# Patient Record
Sex: Female | Born: 1975
Health system: Southern US, Community
[De-identification: ages and names within clinical notes are randomized; demographics above are authoritative.]

## PROBLEM LIST (undated history)

## (undated) DIAGNOSIS — F32A Depression, unspecified: Secondary | ICD-10-CM

## (undated) DIAGNOSIS — B373 Candidiasis of vulva and vagina: Secondary | ICD-10-CM

## (undated) DIAGNOSIS — N201 Calculus of ureter: Secondary | ICD-10-CM

## (undated) DIAGNOSIS — B3731 Acute candidiasis of vulva and vagina: Secondary | ICD-10-CM

## (undated) DIAGNOSIS — F419 Anxiety disorder, unspecified: Secondary | ICD-10-CM

## (undated) DIAGNOSIS — K219 Gastro-esophageal reflux disease without esophagitis: Secondary | ICD-10-CM

## (undated) DIAGNOSIS — Z8759 Personal history of other complications of pregnancy, childbirth and the puerperium: Secondary | ICD-10-CM

## (undated) DIAGNOSIS — Z8742 Personal history of other diseases of the female genital tract: Secondary | ICD-10-CM

## (undated) DIAGNOSIS — R350 Frequency of micturition: Secondary | ICD-10-CM

## (undated) DIAGNOSIS — R3915 Urgency of urination: Secondary | ICD-10-CM

## (undated) DIAGNOSIS — Z973 Presence of spectacles and contact lenses: Secondary | ICD-10-CM

## (undated) HISTORY — DX: Anxiety disorder, unspecified: F41.9

## (undated) HISTORY — DX: Depression, unspecified: F32.A

## (undated) HISTORY — DX: Personal history of other complications of pregnancy, childbirth and the puerperium: Z87.59

---

## 1999-07-23 HISTORY — PX: WISDOM TOOTH EXTRACTION: SHX21

## 2009-11-01 ENCOUNTER — Encounter: Admission: RE | Admit: 2009-11-01 | Discharge: 2009-11-01 | Payer: Self-pay | Admitting: Family Medicine

## 2010-08-12 ENCOUNTER — Encounter: Payer: Self-pay | Admitting: Otolaryngology

## 2012-07-22 NOTE — L&D Delivery Note (Signed)
Delivery Note At 5:26 PM a viable and healthy female was delivered via VBAC, Spontaneous (Presentation: Right Occiput Anterior).  APGAR: 9, 9; weight -  pending Placenta status: Intact, Spontaneous.  Cord: 3 vessels with the following complications: None.  Cord pH: N/A  Anesthesia: Epidural  Episiotomy: None Lacerations: small left labial  Suture Repair: none Est. Blood Loss (mL): 250  Mom to postpartum.  Baby to nursery-stable.  Jamilia Jacques R 04/26/2013, 5:44 PM

## 2012-09-08 LAB — OB RESULTS CONSOLE HEPATITIS B SURFACE ANTIGEN: Hepatitis B Surface Ag: NEGATIVE

## 2012-09-08 LAB — OB RESULTS CONSOLE GC/CHLAMYDIA: Gonorrhea: NEGATIVE

## 2012-09-08 LAB — OB RESULTS CONSOLE RUBELLA ANTIBODY, IGM: Rubella: UNDETERMINED

## 2012-09-08 LAB — OB RESULTS CONSOLE RPR: RPR: NONREACTIVE

## 2012-09-08 LAB — OB RESULTS CONSOLE ANTIBODY SCREEN: Antibody Screen: NEGATIVE

## 2013-03-17 LAB — OB RESULTS CONSOLE GBS: GBS: NEGATIVE

## 2013-04-22 ENCOUNTER — Other Ambulatory Visit: Payer: Self-pay | Admitting: Obstetrics & Gynecology

## 2013-04-23 ENCOUNTER — Encounter (HOSPITAL_COMMUNITY): Payer: Self-pay | Admitting: *Deleted

## 2013-04-23 ENCOUNTER — Telehealth (HOSPITAL_COMMUNITY): Payer: Self-pay | Admitting: *Deleted

## 2013-04-23 NOTE — Telephone Encounter (Signed)
Preadmission screen  

## 2013-04-25 ENCOUNTER — Inpatient Hospital Stay (HOSPITAL_COMMUNITY)
Admission: AD | Admit: 2013-04-25 | Discharge: 2013-04-28 | DRG: 373 | Disposition: A | Discharge status: Home / Self Care | Payer: BC Managed Care – PPO | Source: Ambulatory Visit | Attending: Obstetrics and Gynecology | Admitting: Obstetrics and Gynecology

## 2013-04-25 ENCOUNTER — Encounter (HOSPITAL_COMMUNITY): Payer: Self-pay | Admitting: *Deleted

## 2013-04-25 DIAGNOSIS — Z98891 History of uterine scar from previous surgery: Secondary | ICD-10-CM

## 2013-04-25 DIAGNOSIS — O34219 Maternal care for unspecified type scar from previous cesarean delivery: Secondary | ICD-10-CM | POA: Diagnosis not present

## 2013-04-25 DIAGNOSIS — O480 Post-term pregnancy: Secondary | ICD-10-CM | POA: Diagnosis present

## 2013-04-25 DIAGNOSIS — O09529 Supervision of elderly multigravida, unspecified trimester: Secondary | ICD-10-CM | POA: Diagnosis present

## 2013-04-25 DIAGNOSIS — O9902 Anemia complicating childbirth: Secondary | ICD-10-CM | POA: Diagnosis present

## 2013-04-25 DIAGNOSIS — D509 Iron deficiency anemia, unspecified: Secondary | ICD-10-CM | POA: Diagnosis present

## 2013-04-25 LAB — CBC
HCT: 31.2 % — ABNORMAL LOW (ref 36.0–46.0)
Hemoglobin: 10.7 g/dL — ABNORMAL LOW (ref 12.0–15.0)
MCH: 28.8 pg (ref 26.0–34.0)
MCHC: 34.3 g/dL (ref 30.0–36.0)
MCV: 83.9 fL (ref 78.0–100.0)
Platelets: 145 10*3/uL — ABNORMAL LOW (ref 150–400)
RBC: 3.72 MIL/uL — ABNORMAL LOW (ref 3.87–5.11)
WBC: 8.3 10*3/uL (ref 4.0–10.5)

## 2013-04-25 LAB — AMNISURE RUPTURE OF MEMBRANE (ROM) NOT AT ARMC: Amnisure ROM: POSITIVE

## 2013-04-25 LAB — TYPE AND SCREEN: Antibody Screen: NEGATIVE

## 2013-04-25 LAB — ABO/RH: ABO/RH(D): O POS

## 2013-04-25 MED ORDER — TERBUTALINE SULFATE 1 MG/ML IJ SOLN: 0.2500 mg | Freq: Once | INTRAMUSCULAR | Status: AC | PRN

## 2013-04-25 MED ORDER — FLEET ENEMA 7-19 GM/118ML RE ENEM: 1.0000 | ENEMA | RECTAL | Status: DC | PRN

## 2013-04-25 MED ORDER — ACETAMINOPHEN 325 MG PO TABS: 650.0000 mg | ORAL_TABLET | ORAL | Status: DC | PRN

## 2013-04-25 MED ORDER — NALBUPHINE HCL 20 MG/ML IJ SOLN
10.0000 mg | INTRAMUSCULAR | Status: DC | PRN
  Filled 2013-04-25: qty 1

## 2013-04-25 MED ORDER — LACTATED RINGERS IV SOLN
INTRAVENOUS | Status: DC
  Administered 2013-04-25 – 2013-04-26 (×3): via INTRAVENOUS

## 2013-04-25 MED ORDER — IBUPROFEN 600 MG PO TABS: 600.0000 mg | ORAL_TABLET | Freq: Four times a day (QID) | ORAL | Status: DC | PRN

## 2013-04-25 MED ORDER — LIDOCAINE HCL (PF) 1 % IJ SOLN
30.0000 mL | INTRAMUSCULAR | Status: DC | PRN
  Filled 2013-04-25: qty 30

## 2013-04-25 MED ORDER — ONDANSETRON HCL 4 MG/2ML IJ SOLN
4.0000 mg | Freq: Four times a day (QID) | INTRAMUSCULAR | Status: DC | PRN
  Administered 2013-04-26: 4 mg via INTRAVENOUS
  Filled 2013-04-25: qty 2

## 2013-04-25 MED ORDER — OXYTOCIN 40 UNITS IN LACTATED RINGERS INFUSION - SIMPLE MED
62.5000 mL/h | INTRAVENOUS | Status: DC
  Administered 2013-04-26: 62.5 mL/h via INTRAVENOUS

## 2013-04-25 MED ORDER — OXYCODONE-ACETAMINOPHEN 5-325 MG PO TABS: 1.0000 | ORAL_TABLET | ORAL | Status: DC | PRN

## 2013-04-25 MED ORDER — OXYTOCIN BOLUS FROM INFUSION: 500.0000 mL | INTRAVENOUS | Status: DC

## 2013-04-25 MED ORDER — OXYTOCIN 40 UNITS IN LACTATED RINGERS INFUSION - SIMPLE MED
1.0000 m[IU]/min | INTRAVENOUS | Status: DC
  Administered 2013-04-25: 2 m[IU]/min via INTRAVENOUS
  Filled 2013-04-25: qty 1000

## 2013-04-25 MED ORDER — CITRIC ACID-SODIUM CITRATE 334-500 MG/5ML PO SOLN
30.0000 mL | ORAL | Status: DC | PRN
  Administered 2013-04-25: 30 mL via ORAL
  Filled 2013-04-25: qty 15

## 2013-04-25 MED ORDER — LACTATED RINGERS IV SOLN
500.0000 mL | INTRAVENOUS | Status: DC | PRN
  Administered 2013-04-26: 1000 mL via INTRAVENOUS

## 2013-04-25 NOTE — MAU Note (Signed)
Patient presents with complaint of leaking fluid since 1200 today.

## 2013-04-25 NOTE — MAU Note (Signed)
Fern negative.

## 2013-04-25 NOTE — H&P (Signed)
Melissa Beck is a 37 y.o. female presenting for admission due to leaking fluid since 72 30 pm/ Prev LTCS @ 32 weeks desires VBAC. SROM.  Clear fluid. sono 9/30 EFW 9 lb amniosure positive . History OB History   Grav Para Term Preterm Abortions TAB SAB Ect Mult Living   3 2 1 1      2      Past Medical History  Diagnosis Date  . Abnormal Pap smear   . History of placenta abruption   . AMA (advanced maternal age) multigravida 35+   . Tobacco use disorder    Past Surgical History  Procedure Laterality Date  . Gynecologic cryosurgery    . Cesarean section     Family History: family history includes COPD in her mother; Thrombocytopenia in her daughter. Social History:  reports that she has quit smoking. She does not have any smokeless tobacco history on file. Her alcohol and drug histories are not on file.   Prenatal Transfer Tool  Maternal Diabetes: No Genetic Screening: Normal Maternal Ultrasounds/Referrals: Normal Fetal Ultrasounds or other Referrals:  None Maternal Substance Abuse:  No Significant Maternal Medications:  None Significant Maternal Lab Results:  Lab values include: Group B Strep negative Other Comments:  None  ROS neg  Dilation: 1.5 Effacement (%): 80 Station: -3 Exam by:: L. Munford RN Blood pressure 133/74, pulse 80, temperature 98.2 F (36.8 C), temperature source Oral, resp. rate 18, height 5\' 7"  (1.702 m), weight 97.523 kg (215 lb), last menstrual period 07/15/2012. Maternal Exam:  Uterine Assessment: Contraction strength is mild.  Contraction frequency is irregular.   Abdomen: Patient reports no abdominal tenderness. Surgical scars: low transverse.   Estimated fetal weight is 9lb.   Fetal presentation: vertex  Introitus: Ferning test: negative.   Pelvis: adequate for delivery.   Cervix: Cervix evaluated by digital exam.     Fetal Exam Fetal Monitor Review: Baseline rate: 140.  Variability: moderate (6-25 bpm).   Pattern: accelerations present.     Fetal State Assessment: Category I - tracings are normal.     Physical Exam  Constitutional: She is oriented to person, place, and time. She appears well-developed and well-nourished.  HENT:  Head: Atraumatic.  Eyes: EOM are normal.  Neck: Neck supple.  Cardiovascular: Regular rhythm.   Respiratory: Breath sounds normal.  GI: Soft.  Musculoskeletal: She exhibits edema.  Neurological: She is alert and oriented to person, place, and time.  Skin: Skin is warm and dry.  Psychiatric: She has a normal mood and affect.    Prenatal labs: ABO, Rh: O/Positive/-- (02/18 0000) Antibody: Negative (02/18 0000) Rubella: Equivocal (02/18 0000) RPR: Nonreactive (02/18 0000)  HBsAg: Negative (02/18 0000)  HIV: Non-reactive (02/18 0000)  GBS: Negative (08/27 0000)   Assessment/Plan: Post term Previous C/S desires VBAC SROM P) admit pitocin augmentation routine labs. Epidural/analgesic  prn  Rubella vaccine pp  Intisar Claudio A 04/25/2013, 7:43 PM

## 2013-04-25 NOTE — MAU Note (Signed)
Pt presents with complaints of leakage of fluid since noon today. Denies any bleeding. States some mild contractions

## 2013-04-25 NOTE — MAU Note (Signed)
Specimen obtained for ferning.

## 2013-04-26 ENCOUNTER — Encounter (HOSPITAL_COMMUNITY): Payer: Self-pay | Admitting: Anesthesiology

## 2013-04-26 ENCOUNTER — Inpatient Hospital Stay (HOSPITAL_COMMUNITY): Payer: BC Managed Care – PPO | Admitting: Anesthesiology

## 2013-04-26 DIAGNOSIS — O34219 Maternal care for unspecified type scar from previous cesarean delivery: Secondary | ICD-10-CM | POA: Diagnosis not present

## 2013-04-26 LAB — CBC
HCT: 32.1 % — ABNORMAL LOW (ref 36.0–46.0)
Hemoglobin: 10.7 g/dL — ABNORMAL LOW (ref 12.0–15.0)
MCH: 28.2 pg (ref 26.0–34.0)
MCHC: 33.3 g/dL (ref 30.0–36.0)
MCV: 84.5 fL (ref 78.0–100.0)
RBC: 3.8 MIL/uL — ABNORMAL LOW (ref 3.87–5.11)

## 2013-04-26 LAB — RPR: RPR Ser Ql: NONREACTIVE

## 2013-04-26 MED ORDER — SIMETHICONE 80 MG PO CHEW: 80.0000 mg | CHEWABLE_TABLET | ORAL | Status: DC | PRN

## 2013-04-26 MED ORDER — PHENYLEPHRINE 40 MCG/ML (10ML) SYRINGE FOR IV PUSH (FOR BLOOD PRESSURE SUPPORT)
80.0000 ug | PREFILLED_SYRINGE | INTRAVENOUS | Status: DC | PRN
  Filled 2013-04-26: qty 5
  Filled 2013-04-26: qty 2

## 2013-04-26 MED ORDER — LIDOCAINE HCL (PF) 1 % IJ SOLN
INTRAMUSCULAR | Status: DC | PRN
  Administered 2013-04-26 (×2): 4 mL

## 2013-04-26 MED ORDER — ONDANSETRON HCL 4 MG PO TABS: 4.0000 mg | ORAL_TABLET | ORAL | Status: DC | PRN

## 2013-04-26 MED ORDER — NALBUPHINE SYRINGE 5 MG/0.5 ML
10.0000 mg | INJECTION | INTRAMUSCULAR | Status: DC | PRN
  Administered 2013-04-26: 10 mg via INTRAVENOUS
  Filled 2013-04-26 (×2): qty 1

## 2013-04-26 MED ORDER — LANOLIN HYDROUS EX OINT: TOPICAL_OINTMENT | CUTANEOUS | Status: DC | PRN

## 2013-04-26 MED ORDER — SENNOSIDES-DOCUSATE SODIUM 8.6-50 MG PO TABS
2.0000 | ORAL_TABLET | ORAL | Status: DC
  Administered 2013-04-27 (×2): 2 via ORAL

## 2013-04-26 MED ORDER — ONDANSETRON HCL 4 MG/2ML IJ SOLN: 4.0000 mg | INTRAMUSCULAR | Status: DC | PRN

## 2013-04-26 MED ORDER — DIPHENHYDRAMINE HCL 50 MG/ML IJ SOLN: 12.5000 mg | INTRAMUSCULAR | Status: DC | PRN

## 2013-04-26 MED ORDER — FENTANYL 2.5 MCG/ML BUPIVACAINE 1/10 % EPIDURAL INFUSION (WH - ANES)
14.0000 mL/h | INTRAMUSCULAR | Status: DC | PRN
  Filled 2013-04-26: qty 125

## 2013-04-26 MED ORDER — PHENYLEPHRINE 40 MCG/ML (10ML) SYRINGE FOR IV PUSH (FOR BLOOD PRESSURE SUPPORT)
80.0000 ug | PREFILLED_SYRINGE | INTRAVENOUS | Status: DC | PRN
  Filled 2013-04-26: qty 2

## 2013-04-26 MED ORDER — EPHEDRINE 5 MG/ML INJ
10.0000 mg | INTRAVENOUS | Status: DC | PRN
  Filled 2013-04-26: qty 2

## 2013-04-26 MED ORDER — FENTANYL 2.5 MCG/ML BUPIVACAINE 1/10 % EPIDURAL INFUSION (WH - ANES)
INTRAMUSCULAR | Status: DC | PRN
  Administered 2013-04-26: 14 mL/h via EPIDURAL

## 2013-04-26 MED ORDER — IBUPROFEN 600 MG PO TABS
600.0000 mg | ORAL_TABLET | Freq: Four times a day (QID) | ORAL | Status: DC
  Administered 2013-04-26 – 2013-04-28 (×6): 600 mg via ORAL
  Filled 2013-04-26 (×6): qty 1

## 2013-04-26 MED ORDER — EPHEDRINE 5 MG/ML INJ
10.0000 mg | INTRAVENOUS | Status: DC | PRN
  Filled 2013-04-26: qty 2
  Filled 2013-04-26: qty 4

## 2013-04-26 MED ORDER — DIBUCAINE 1 % RE OINT: 1.0000 "application " | TOPICAL_OINTMENT | RECTAL | Status: DC | PRN

## 2013-04-26 MED ORDER — OXYCODONE-ACETAMINOPHEN 5-325 MG PO TABS
1.0000 | ORAL_TABLET | ORAL | Status: DC | PRN
  Administered 2013-04-27: 1 via ORAL
  Filled 2013-04-26: qty 1

## 2013-04-26 MED ORDER — BENZOCAINE-MENTHOL 20-0.5 % EX AERO: 1.0000 "application " | INHALATION_SPRAY | CUTANEOUS | Status: DC | PRN

## 2013-04-26 MED ORDER — BUTORPHANOL TARTRATE 1 MG/ML IJ SOLN: 1.0000 mg | Freq: Once | INTRAMUSCULAR | Status: DC

## 2013-04-26 MED ORDER — TETANUS-DIPHTH-ACELL PERTUSSIS 5-2.5-18.5 LF-MCG/0.5 IM SUSP: 0.5000 mL | Freq: Once | INTRAMUSCULAR | Status: DC

## 2013-04-26 MED ORDER — LACTATED RINGERS IV SOLN: 500.0000 mL | Freq: Once | INTRAVENOUS | Status: DC

## 2013-04-26 MED ORDER — PRENATAL MULTIVITAMIN CH
1.0000 | ORAL_TABLET | Freq: Every day | ORAL | Status: DC
  Administered 2013-04-27: 1 via ORAL
  Filled 2013-04-26: qty 1

## 2013-04-26 MED ORDER — ZOLPIDEM TARTRATE 5 MG PO TABS: 5.0000 mg | ORAL_TABLET | Freq: Every evening | ORAL | Status: DC | PRN

## 2013-04-26 MED ORDER — DIPHENHYDRAMINE HCL 25 MG PO CAPS: 25.0000 mg | ORAL_CAPSULE | Freq: Four times a day (QID) | ORAL | Status: DC | PRN

## 2013-04-26 MED ORDER — WITCH HAZEL-GLYCERIN EX PADS: 1.0000 "application " | MEDICATED_PAD | CUTANEOUS | Status: DC | PRN

## 2013-04-26 NOTE — Progress Notes (Signed)
Melissa Beck is a 37 y.o. [id number] at [redacted]w[redacted]d by LMP admitted for rupture of membranes  Subjective: Chief Complaint  Patient presents with  . Rupture of Membranes   Not feeling much ctx Objective: BP 125/75  Pulse 75  Temp(Src) 97.9 F (36.6 C) (Oral)  Resp 20  Ht 5\' 7"  (1.702 m)  Wt 97.523 kg (215 lb)  BMI 33.67 kg/m2  SpO2 98%  LMP 07/15/2012    Pitocin 9 miu  FHT:  FHR: 130 bpm, variability: moderate,  accelerations:  Present,  decelerations:  Absent UC:   irregular, every ? 2-4 mins minutes SVE:   {2/60/-3 deviated to left Tracing: cat1  Labs: Lab Results  Component Value Date   WBC 8.3 04/25/2013   HGB 10.7* 04/25/2013   HCT 31.2* 04/25/2013   MCV 83.9 04/25/2013   PLT 145* 04/25/2013    Assessment / Plan: SROM Term gestation P) cont pitocin  Anticipated MOD:  NSVD  Melissa Beck A 04/26/2013, 9:47 AM

## 2013-04-26 NOTE — Progress Notes (Signed)
Melissa Beck is a 37 y.o. [id number] at [redacted]w[redacted]d by LMP admitted for rupture of membranes  Subjective: Chief Complaint  Patient presents with  . Rupture of Membranes  Pt was not started until 10 30 pm( per RN waited for husband to go home and get some things)  Objective:  Pitocin 4 MU BP 126/79  Pulse 76  Temp(Src) 98 F (36.7 C) (Oral)  Resp 18  Ht 5\' 7"  (1.702 m)  Wt 97.523 kg (215 lb)  BMI 33.67 kg/m2  LMP 07/15/2012  VE deferred    FHT:  FHR: 130 bpm, variability: moderate,  accelerations:  Present,  decelerations:  Absent UC:   irreg SVE:   deferred Tracing: cat 1  Labs: Lab Results  Component Value Date   WBC 8.3 04/25/2013   HGB 10.7* 04/25/2013   HCT 31.2* 04/25/2013   MCV 83.9 04/25/2013   PLT 145* 04/25/2013    Assessment / Plan: SROM Postterm Previous C/S P) advised RN to increase pitocin  Anticipated MOD:  NSVD  Ashliegh Parekh A 04/26/2013, 12:36 AM

## 2013-04-26 NOTE — Progress Notes (Signed)
Melissa Beck is a 37 y.o. G3P1102 at [redacted]w[redacted]d, admitted yesterday after SROM. Pitocin overnight and went into active labor this afternoon. S/p epidural and comfortable. Was 9 cm at 2 pm and completely dilated at 4.15 pm, Will increase pitocin and reassess labor IOL   Objective: BP 121/81  Pulse 83  Temp(Src) 98.4 F (36.9 C) (Oral)  Resp 20  Ht 5\' 7"  (1.702 m)  Wt 215 lb (97.523 kg)  BMI 33.67 kg/m2  SpO2 100%  LMP 07/15/2012   Total I/O In: -  Out: 250 [Urine:250]  FHT:  FHR: 130 bpm, variability: moderate,  accelerations:  Present,  decelerations:  Present occ variable decels. recurrent decels resolved with pitocin was cut back from 18 to 5 mu rate, UC:   regular, every 3-4 minutes SVE:   Dilation: 10 Effacement (%): 100 Station: 0 Exam by:: Shivon Hackel  Assessment / Plan: Spontaneous labor, progressing normally  Labor: Progressing normally Fetal Wellbeing:  Category I Pain Control:  Epidural Anticipated MOD:  NSVD, 9 lbs, prepare for shoulder dystocia  Kaito Schulenburg R 04/26/2013, 4:52 PM

## 2013-04-26 NOTE — Anesthesia Preprocedure Evaluation (Signed)
Anesthesia Evaluation  Patient identified by MRN, date of birth, ID band Patient awake    Reviewed: Allergy & Precautions, H&P , Patient's Chart, lab work & pertinent test results  Airway Mallampati: II TM Distance: >3 FB Neck ROM: Full    Dental no notable dental hx. (+) Teeth Intact   Pulmonary neg pulmonary ROS,  breath sounds clear to auscultation- rhonchi  Pulmonary exam normal       Cardiovascular negative cardio ROS  Rhythm:Regular Rate:Normal     Neuro/Psych PSYCHIATRIC DISORDERS negative neurological ROS     GI/Hepatic Neg liver ROS, GERD-  Medicated and Controlled,  Endo/Other  negative endocrine ROS  Renal/GU negative Renal ROS  negative genitourinary   Musculoskeletal negative musculoskeletal ROS (+)   Abdominal   Peds  Hematology Thrombocytopenia-mild   Anesthesia Other Findings   Reproductive/Obstetrics Previous C/Section for Placental abruption                           Anesthesia Physical Anesthesia Plan  ASA: II  Anesthesia Plan: Epidural   Post-op Pain Management:    Induction:   Airway Management Planned: Natural Airway  Additional Equipment:   Intra-op Plan:   Post-operative Plan:   Informed Consent: I have reviewed the patients History and Physical, chart, labs and discussed the procedure including the risks, benefits and alternatives for the proposed anesthesia with the patient or authorized representative who has indicated his/her understanding and acceptance.   Dental advisory given  Plan Discussed with: Anesthesiologist  Anesthesia Plan Comments:         Anesthesia Quick Evaluation

## 2013-04-26 NOTE — Anesthesia Procedure Notes (Signed)
Epidural Patient location during procedure: OB Start time: 04/26/2013 1:19 PM  Staffing Anesthesiologist: Anda Sobotta A. Performed by: anesthesiologist   Preanesthetic Checklist Completed: patient identified, site marked, surgical consent, pre-op evaluation, timeout performed, IV checked, risks and benefits discussed and monitors and equipment checked  Epidural Patient position: sitting Prep: site prepped and draped and DuraPrep Patient monitoring: continuous pulse ox and blood pressure Approach: midline Injection technique: LOR air  Needle:  Needle type: Tuohy  Needle gauge: 17 G Needle length: 9 cm and 9 Needle insertion depth: 6 cm Catheter type: closed end flexible Catheter size: 19 Gauge Catheter at skin depth: 10 cm Test dose: negative and Other  Assessment Events: blood not aspirated, injection not painful, no injection resistance, negative IV test and no paresthesia  Additional Notes Patient identified. Risks and benefits discussed including failed block, incomplete  Pain control, post dural puncture headache, nerve damage, paralysis, blood pressure Changes, nausea, vomiting, reactions to medications-both toxic and allergic and post Partum back pain. All questions were answered. Patient expressed understanding and wished to proceed. Sterile technique was used throughout procedure. Epidural site was Dressed with sterile barrier dressing. No paresthesias, signs of intravascular injection Or signs of intrathecal spread were encountered.  Patient was more comfortable after the epidural was dosed. Please see RN's note for documentation of vital signs and FHR which are stable.

## 2013-04-26 NOTE — Progress Notes (Signed)
Ephedrine unused after epidural wasted. Phenylephrine wasted not used. Dherr rn Mickie Bail, RNC

## 2013-04-27 ENCOUNTER — Inpatient Hospital Stay (HOSPITAL_COMMUNITY): Admission: RE | Admit: 2013-04-27 | Payer: BC Managed Care – PPO | Source: Ambulatory Visit

## 2013-04-27 ENCOUNTER — Encounter (HOSPITAL_COMMUNITY): Payer: Self-pay | Admitting: Obstetrics and Gynecology

## 2013-04-27 LAB — CBC
HCT: 27.7 % — ABNORMAL LOW (ref 36.0–46.0)
Hemoglobin: 9.2 g/dL — ABNORMAL LOW (ref 12.0–15.0)
MCH: 28 pg (ref 26.0–34.0)
MCHC: 33.2 g/dL (ref 30.0–36.0)
MCV: 84.5 fL (ref 78.0–100.0)
RBC: 3.28 MIL/uL — ABNORMAL LOW (ref 3.87–5.11)

## 2013-04-27 LAB — SURGICAL PCR SCREEN: Staphylococcus aureus: POSITIVE — AB

## 2013-04-27 MED ORDER — FAMOTIDINE 20 MG PO TABS
40.0000 mg | ORAL_TABLET | Freq: Once | ORAL | Status: AC
  Administered 2013-04-27: 40 mg via ORAL
  Filled 2013-04-27: qty 1

## 2013-04-27 MED ORDER — CHLORHEXIDINE GLUCONATE CLOTH 2 % EX PADS: 6.0000 | MEDICATED_PAD | Freq: Every day | CUTANEOUS | Status: DC

## 2013-04-27 MED ORDER — LACTATED RINGERS IV SOLN: INTRAVENOUS | Status: DC

## 2013-04-27 MED ORDER — POLYSACCHARIDE IRON COMPLEX 150 MG PO CAPS
150.0000 mg | ORAL_CAPSULE | Freq: Every day | ORAL | Status: DC
  Administered 2013-04-27 – 2013-04-28 (×2): 150 mg via ORAL
  Filled 2013-04-27 (×2): qty 1

## 2013-04-27 MED ORDER — METOCLOPRAMIDE HCL 10 MG PO TABS
10.0000 mg | ORAL_TABLET | Freq: Once | ORAL | Status: AC
  Administered 2013-04-27: 10 mg via ORAL
  Filled 2013-04-27: qty 1

## 2013-04-27 MED ORDER — MUPIROCIN 2 % EX OINT
1.0000 "application " | TOPICAL_OINTMENT | Freq: Two times a day (BID) | CUTANEOUS | Status: DC
  Administered 2013-04-27 (×3): 1 via NASAL
  Filled 2013-04-27: qty 22

## 2013-04-27 NOTE — Progress Notes (Signed)
PPD 1 SVD  S:  Reports feeling well -desires postpartum tubal sterilization today              Desires discharge this afternoon             Tolerating po/ No nausea or vomiting             Bleeding is light             Pain controlled with motrin             Up ad lib / ambulatory / voiding QS  Newborn breast feeding  / Circumcision planned today  O:               VS: BP 98/64  Pulse 79  Temp(Src) 98.4 F (36.9 C) (Oral)  Resp 18  Ht 5\' 7"  (1.702 m)  Wt 97.523 kg (215 lb)  BMI 33.67 kg/m2  SpO2 100%  LMP 07/15/2012   LABS:              Recent Labs  04/26/13 1237 04/27/13 0610  WBC 8.2 9.8  HGB 10.7* 9.2*  PLT 141* 130*               Blood type: --/--/O POS, O POS (10/05 1940)  Rubella: Equivocal (02/18 0000)                     I&O: Intake/Output     10/06 0701 - 10/07 0700 10/07 0701 - 10/08 0700   Urine (mL/kg/hr) 850 (0.4)    Blood 250 (0.1)    Total Output 1100     Net -1100                        Physical Exam:             Alert and oriented X3  Lungs: Clear and unlabored  Heart: regular rate and rhythm / no mumurs  Abdomen: soft, non-tender, non-distended              Fundus: firm, non-tender, U-1  Perineum: no edema  Lochia: light  Extremities: no edema, no calf pain or tenderness    A: PPD # 1              Undesired fertility - request tubal sterilization during current hospitalization             IDA of pregnancy - delivered  Doing well - stable status  P: Routine post partum orders  NPO for BTL this afternoon with Dr Seymour Bars             Iron supplements x 6 weeks             Anticipate DC home later this pm if stable s/p BTL   Melissa Beck CNM, MSN, [facility] 04/27/2013, 9:33 AM

## 2013-04-27 NOTE — Anesthesia Postprocedure Evaluation (Signed)
  Anesthesia Post-op Note  Anesthesia Post Note  Patient: Melissa Beck  Procedure(s) Performed: * No procedures listed *  Anesthesia type: Epidural  Patient location: Mother/Baby  Post pain: Pain level controlled  Post assessment: Post-op Vital signs reviewed  Last Vitals:  Filed Vitals:   04/27/13 0555  BP: 105/69  Pulse: 72  Temp: 36.3 C  Resp: 16    Post vital signs: Reviewed  Level of consciousness:alert  Complications: No apparent anesthesia complications

## 2013-04-28 MED ORDER — MEASLES, MUMPS & RUBELLA VAC ~~LOC~~ INJ
0.5000 mL | INJECTION | Freq: Once | SUBCUTANEOUS | Status: AC
  Administered 2013-04-28: 0.5 mL via SUBCUTANEOUS
  Filled 2013-04-28 (×2): qty 0.5

## 2013-04-28 MED ORDER — IBUPROFEN 600 MG PO TABS: 600.0000 mg | ORAL_TABLET | Freq: Four times a day (QID) | ORAL | Status: DC

## 2013-04-28 NOTE — Progress Notes (Signed)
Patient ID: Alzina Golda, female   DOB: Jul 26, 1975, 37 y.o.   MRN: [id number] Post Partum Day #2            Information for the patient's newborn:  Mylah, Baynes [[id number]]  female   / circumcision in progress Feeding: breast  Subjective: No HA, SOB, CP, F/C, breast symptoms. Pain well-controlled with ibuprofen. Normal vaginal bleeding, no clots.      Objective:  Temp:  [98 F (36.7 C)-99 F (37.2 C)] 98 F (36.7 C) (10/08 0628) Pulse Rate:  [65-80] 65 (10/08 0628) Resp:  [16-18] 18 (10/08 0628) BP: (98-126)/(64-82) 120/82 mmHg (10/08 0628)   Recent Labs  04/26/13 1237 04/27/13 0610  WBC 8.2 9.8  HGB 10.7* 9.2*  HCT 32.1* 27.7*  PLT 141* 130*    Blood type: O POS, O POS (10/05 1940) Rubella: Equivocal (02/18 0000)    Physical Exam:  General: alert, cooperative and no distress Uterine Fundus: firm, midline, U-2 Lochia: appropriate, minimal Perineum: No edema  DVT Evaluation: No evidence of DVT seen on physical exam. Negative Homan's sign. No cords or calf tenderness. Calf/Ankle 1+ edema is present.    Assessment/Plan: PPD # 2 / 37 y.o., [id number] S/P: vaginal delivery   Principal Problem:   Postpartum care following vaginal delivery (10/6) Active Problems:   Previous cesarean section - tolac   VBAC, delivered (10/6) IDA anemia, delivered   Normal postpartum exam  Continue current postpartum care  D/C home   LOS: 3 days   Raelyn Mora, M, MSN, CNM 04/28/2013, 8:22 AM

## 2013-04-28 NOTE — Discharge Summary (Signed)
Obstetric Discharge Summary Reason for Admission: rupture of membranes Prenatal Procedures: ultrasound Intrapartum Procedures: spontaneous vaginal delivery Postpartum Procedures: none Complications-Operative and Postpartum: IDA Anemia Hemoglobin  Date Value Range Status  04/27/2013 9.2* 12.0 - 15.0 g/dL Final     HCT  Date Value Range Status  04/27/2013 27.7* 36.0 - 46.0 % Final    Physical Exam:  General: alert, cooperative and no distress Lochia: appropriate Uterine Fundus: firm DVT Evaluation: No evidence of DVT seen on physical exam. Negative Homan's sign. Calf/Ankle 1+ edema is present.  Discharge Diagnoses: Term Pregnancy-delivered        VBAC Discharge Information: Date: 04/28/2013 Activity: pelvic rest Diet: routine Medications: PNV, Ibuprofen and Iron Condition: stable Instructions: refer to practice specific booklet Discharge to: home Follow-up Information   Follow up with LAVOIE,MARIE-LYNE, MD. Schedule an appointment as soon as possible for a visit in 6 weeks. (Call to schedule outpatient tubal ligation)    Specialty:  Obstetrics and Gynecology   Contact information:   [address] [place] [address] [phone]       Newborn Data: Live born female on 04/26/2013 Birth Weight: 8 lb 2.7 oz (3705 g) APGAR: 9, 9  Home with mother.  Kenard Gower, MSN, CNM 04/28/2013, 8:29 AM

## 2013-05-14 ENCOUNTER — Encounter (HOSPITAL_COMMUNITY): Payer: Self-pay | Admitting: Pharmacist

## 2013-05-17 ENCOUNTER — Encounter (HOSPITAL_COMMUNITY): Payer: Self-pay

## 2013-05-24 ENCOUNTER — Other Ambulatory Visit: Payer: Self-pay | Admitting: Obstetrics & Gynecology

## 2013-05-28 ENCOUNTER — Ambulatory Visit (HOSPITAL_COMMUNITY)
Admission: RE | Admit: 2013-05-28 | Discharge: 2013-05-28 | Disposition: A | Discharge status: Home / Self Care | Payer: BC Managed Care – PPO | Source: Ambulatory Visit | Attending: Obstetrics & Gynecology | Admitting: Obstetrics & Gynecology

## 2013-05-28 ENCOUNTER — Ambulatory Visit (HOSPITAL_COMMUNITY): Payer: BC Managed Care – PPO | Admitting: Anesthesiology

## 2013-05-28 ENCOUNTER — Encounter (HOSPITAL_COMMUNITY)
Admission: RE | Disposition: A | Discharge status: Home / Self Care | Payer: Self-pay | Source: Ambulatory Visit | Attending: Obstetrics & Gynecology

## 2013-05-28 ENCOUNTER — Encounter (HOSPITAL_COMMUNITY): Payer: Self-pay | Admitting: Anesthesiology

## 2013-05-28 ENCOUNTER — Encounter (HOSPITAL_COMMUNITY): Payer: BC Managed Care – PPO | Admitting: Anesthesiology

## 2013-05-28 DIAGNOSIS — Z302 Encounter for sterilization: Secondary | ICD-10-CM | POA: Insufficient documentation

## 2013-05-28 HISTORY — PX: LAPAROSCOPIC TUBAL LIGATION: SHX1937

## 2013-05-28 LAB — CBC
HCT: 36.9 % (ref 36.0–46.0)
Hemoglobin: 12.3 g/dL (ref 12.0–15.0)
MCH: 27.4 pg (ref 26.0–34.0)
MCHC: 33.3 g/dL (ref 30.0–36.0)
MCV: 82.2 fL (ref 78.0–100.0)
RDW: 13.8 % (ref 11.5–15.5)

## 2013-05-28 SURGERY — LIGATION, FALLOPIAN TUBE, LAPAROSCOPIC
Anesthesia: General | Site: Abdomen | Laterality: Bilateral | Wound class: Clean Contaminated

## 2013-05-28 MED ORDER — LACTATED RINGERS IV SOLN
INTRAVENOUS | Status: DC
  Administered 2013-05-28 (×2): via INTRAVENOUS

## 2013-05-28 MED ORDER — FENTANYL CITRATE 0.05 MG/ML IJ SOLN: 25.0000 ug | INTRAMUSCULAR | Status: DC | PRN

## 2013-05-28 MED ORDER — KETOROLAC TROMETHAMINE 30 MG/ML IJ SOLN
INTRAMUSCULAR | Status: DC | PRN
  Administered 2013-05-28: 30 mg via INTRAVENOUS

## 2013-05-28 MED ORDER — NEOSTIGMINE METHYLSULFATE 1 MG/ML IJ SOLN
INTRAMUSCULAR | Status: AC
  Filled 2013-05-28: qty 1

## 2013-05-28 MED ORDER — ROCURONIUM BROMIDE 100 MG/10ML IV SOLN
INTRAVENOUS | Status: DC | PRN
  Administered 2013-05-28: 25 mg via INTRAVENOUS

## 2013-05-28 MED ORDER — MEPERIDINE HCL 25 MG/ML IJ SOLN: 6.2500 mg | INTRAMUSCULAR | Status: DC | PRN

## 2013-05-28 MED ORDER — MIDAZOLAM HCL 2 MG/2ML IJ SOLN
INTRAMUSCULAR | Status: AC
  Filled 2013-05-28: qty 2

## 2013-05-28 MED ORDER — CEFAZOLIN SODIUM-DEXTROSE 2-3 GM-% IV SOLR
INTRAVENOUS | Status: AC
  Filled 2013-05-28: qty 50

## 2013-05-28 MED ORDER — LIDOCAINE HCL (CARDIAC) 20 MG/ML IV SOLN
INTRAVENOUS | Status: DC | PRN
  Administered 2013-05-28: 70 mg via INTRAVENOUS
  Administered 2013-05-28: 30 mg via INTRAVENOUS

## 2013-05-28 MED ORDER — OXYCODONE-ACETAMINOPHEN 7.5-325 MG PO TABS: 1.0000 | ORAL_TABLET | ORAL | Status: DC | PRN

## 2013-05-28 MED ORDER — KETOROLAC TROMETHAMINE 30 MG/ML IJ SOLN: 15.0000 mg | Freq: Once | INTRAMUSCULAR | Status: DC | PRN

## 2013-05-28 MED ORDER — KETOROLAC TROMETHAMINE 30 MG/ML IJ SOLN
INTRAMUSCULAR | Status: AC
  Filled 2013-05-28: qty 1

## 2013-05-28 MED ORDER — MIDAZOLAM HCL 2 MG/2ML IJ SOLN
INTRAMUSCULAR | Status: DC | PRN
  Administered 2013-05-28: 2 mg via INTRAVENOUS

## 2013-05-28 MED ORDER — LIDOCAINE HCL (CARDIAC) 20 MG/ML IV SOLN
INTRAVENOUS | Status: AC
  Filled 2013-05-28: qty 5

## 2013-05-28 MED ORDER — DEXAMETHASONE SODIUM PHOSPHATE 10 MG/ML IJ SOLN
INTRAMUSCULAR | Status: DC | PRN
  Administered 2013-05-28: 10 mg via INTRAVENOUS

## 2013-05-28 MED ORDER — GLYCOPYRROLATE 0.2 MG/ML IJ SOLN
INTRAMUSCULAR | Status: AC
  Filled 2013-05-28: qty 3

## 2013-05-28 MED ORDER — SCOPOLAMINE 1 MG/3DAYS TD PT72
MEDICATED_PATCH | TRANSDERMAL | Status: AC
  Filled 2013-05-28: qty 1

## 2013-05-28 MED ORDER — ONDANSETRON HCL 4 MG/2ML IJ SOLN
INTRAMUSCULAR | Status: AC
  Filled 2013-05-28: qty 2

## 2013-05-28 MED ORDER — SCOPOLAMINE 1 MG/3DAYS TD PT72
1.0000 | MEDICATED_PATCH | TRANSDERMAL | Status: DC
  Administered 2013-05-28: 1.5 mg via TRANSDERMAL

## 2013-05-28 MED ORDER — ONDANSETRON HCL 4 MG/2ML IJ SOLN: 4.0000 mg | Freq: Once | INTRAMUSCULAR | Status: DC | PRN

## 2013-05-28 MED ORDER — PROPOFOL 10 MG/ML IV BOLUS
INTRAVENOUS | Status: DC | PRN
  Administered 2013-05-28: 160 mg via INTRAVENOUS

## 2013-05-28 MED ORDER — BUPIVACAINE HCL (PF) 0.25 % IJ SOLN
INTRAMUSCULAR | Status: AC
  Filled 2013-05-28: qty 30

## 2013-05-28 MED ORDER — CEFAZOLIN SODIUM-DEXTROSE 2-3 GM-% IV SOLR
2.0000 g | INTRAVENOUS | Status: AC
  Administered 2013-05-28: 2 g via INTRAVENOUS

## 2013-05-28 MED ORDER — FLUMAZENIL 0.5 MG/5ML IV SOLN
INTRAVENOUS | Status: DC | PRN
  Administered 2013-05-28: .15 mg via INTRAVENOUS

## 2013-05-28 MED ORDER — NEOSTIGMINE METHYLSULFATE 1 MG/ML IJ SOLN
INTRAMUSCULAR | Status: DC | PRN
  Administered 2013-05-28: 3 mg via INTRAVENOUS

## 2013-05-28 MED ORDER — PROPOFOL 10 MG/ML IV EMUL
INTRAVENOUS | Status: AC
  Filled 2013-05-28: qty 20

## 2013-05-28 MED ORDER — ROCURONIUM BROMIDE 100 MG/10ML IV SOLN
INTRAVENOUS | Status: AC
  Filled 2013-05-28: qty 1

## 2013-05-28 MED ORDER — GLYCOPYRROLATE 0.2 MG/ML IJ SOLN
INTRAMUSCULAR | Status: DC | PRN
  Administered 2013-05-28: 0.6 mg via INTRAVENOUS

## 2013-05-28 MED ORDER — FENTANYL CITRATE 0.05 MG/ML IJ SOLN
INTRAMUSCULAR | Status: DC | PRN
  Administered 2013-05-28 (×2): 50 ug via INTRAVENOUS

## 2013-05-28 MED ORDER — ONDANSETRON HCL 4 MG/2ML IJ SOLN
INTRAMUSCULAR | Status: DC | PRN
  Administered 2013-05-28: 4 mg via INTRAVENOUS

## 2013-05-28 MED ORDER — DEXAMETHASONE SODIUM PHOSPHATE 10 MG/ML IJ SOLN
INTRAMUSCULAR | Status: AC
  Filled 2013-05-28: qty 1

## 2013-05-28 MED ORDER — FENTANYL CITRATE 0.05 MG/ML IJ SOLN
INTRAMUSCULAR | Status: AC
  Filled 2013-05-28: qty 5

## 2013-05-28 SURGICAL SUPPLY — 20 items
APPLICATOR COTTON TIP 6IN STRL (MISCELLANEOUS) IMPLANT
CATH ROBINSON RED A/P 16FR (CATHETERS) ×2 IMPLANT
CLOTH BEACON ORANGE TIMEOUT ST (SAFETY) ×2 IMPLANT
DERMABOND ADVANCED (GAUZE/BANDAGES/DRESSINGS) ×1
DERMABOND ADVANCED .7 DNX12 (GAUZE/BANDAGES/DRESSINGS) ×1 IMPLANT
ELECT REM PT RETURN 9FT ADLT (ELECTROSURGICAL) ×2
ELECTRODE REM PT RTRN 9FT ADLT (ELECTROSURGICAL) ×1 IMPLANT
GLOVE BIO SURGEON STRL SZ 6.5 (GLOVE) ×2 IMPLANT
GLOVE BIOGEL PI IND STRL 7.0 (GLOVE) ×2 IMPLANT
GLOVE BIOGEL PI INDICATOR 7.0 (GLOVE) ×2
GOWN PREVENTION PLUS LG XLONG (DISPOSABLE) ×4 IMPLANT
PACK LAPAROSCOPY BASIN (CUSTOM PROCEDURE TRAY) ×2 IMPLANT
PAD OB MATERNITY 4.3X12.25 (PERSONAL CARE ITEMS) ×2 IMPLANT
PENCIL BUTTON HOLSTER BLD 10FT (ELECTRODE) ×2 IMPLANT
SUT MNCRL AB 4-0 PS2 18 (SUTURE) ×2 IMPLANT
SUT VICRYL 0 UR6 27IN ABS (SUTURE) ×2 IMPLANT
TOWEL OR 17X24 6PK STRL BLUE (TOWEL DISPOSABLE) ×4 IMPLANT
TROCAR BALLN 12MMX100 BLUNT (TROCAR) ×2 IMPLANT
WARMER LAPAROSCOPE (MISCELLANEOUS) ×2 IMPLANT
WATER STERILE IRR 1000ML POUR (IV SOLUTION) ×2 IMPLANT

## 2013-05-28 NOTE — H&P (Signed)
Melissa Beck is an 37 y.o. female G3P3L3  RP:  Desire for Sterilization:  LPS BT/S  Pertinent Gynecological History:  Contraception: condoms, PP Blood transfusions: none Sexually transmitted diseases: no past history Previous GYN Procedures: none   Last pap: normal OB History: G3P3L3 C/S x 1  Menstrual History:  No LMP recorded.    Past Medical History  Diagnosis Date  . Abnormal Pap smear   . History of placenta abruption   . AMA (advanced maternal age) multigravida 35+   . Tobacco use disorder   . VBAC, delivered (10/6) 04/26/2013  . Postpartum care following vaginal delivery (10/6) 04/27/2013  . Anemia     Past Surgical History  Procedure Laterality Date  . Gynecologic cryosurgery    . Cesarean section    . Wisdom tooth extraction      Family History  Problem Relation Age of Onset  . COPD Mother   . Thrombocytopenia Daughter     Social History:  reports that she quit smoking about 6 months ago. She does not have any smokeless tobacco history on file. She reports that she does not drink alcohol or use illicit drugs.  Allergies: No Known Allergies  Prescriptions prior to admission  Medication Sig Dispense Refill  . Ferrous Sulfate (SLOW FE PO) Take 1 tablet by mouth daily.      Marland Kitchen ibuprofen (ADVIL,MOTRIN) 600 MG tablet Take 600 mg by mouth every 6 (six) hours as needed for pain.      Marland Kitchen lanolin ointment Apply 1 application topically as needed (for breastfeeding).      . Prenatal Vit-Fe Fumarate-FA (PRENATAL MULTIVITAMIN) TABS tablet Take 1 tablet by mouth at bedtime.        ROS  Height 5\' 7"  (1.702 m), currently breastfeeding. Physical Exam  No results found for this or any previous visit (from the past 24 hour(s)).  No results found.  Assessment/Plan: Desire for sterilization for LPS BT/S.  Surgery and risks reviewed.  Caitlinn Klinker,MARIE-LYNE 05/28/2013, 9:08 AM

## 2013-05-28 NOTE — Anesthesia Preprocedure Evaluation (Addendum)
Anesthesia Evaluation  Patient identified by MRN, date of birth, ID band Patient awake    Reviewed: Allergy & Precautions, H&P , NPO status , Patient's Chart, lab work & pertinent test results  Airway Mallampati: I TM Distance: >3 FB Neck ROM: full    Dental no notable dental hx. (+) Teeth Intact   Pulmonary neg pulmonary ROS,    Pulmonary exam normal       Cardiovascular negative cardio ROS      Neuro/Psych negative neurological ROS     GI/Hepatic negative GI ROS, Neg liver ROS,   Endo/Other  negative endocrine ROS  Renal/GU negative Renal ROS  negative genitourinary   Musculoskeletal   Abdominal Normal abdominal exam  (+)   Peds negative pediatric ROS (+)  Hematology negative hematology ROS (+)   Anesthesia Other Findings   Reproductive/Obstetrics negative OB ROS                           Anesthesia Physical Anesthesia Plan  ASA: II  Anesthesia Plan: General   Post-op Pain Management:    Induction: Intravenous  Airway Management Planned: Oral ETT  Additional Equipment:   Intra-op Plan:   Post-operative Plan: Extubation in OR  Informed Consent: I have reviewed the patients History and Physical, chart, labs and discussed the procedure including the risks, benefits and alternatives for the proposed anesthesia with the patient or authorized representative who has indicated his/her understanding and acceptance.   Dental Advisory Given  Plan Discussed with: CRNA and Surgeon  Anesthesia Plan Comments:        Anesthesia Quick Evaluation

## 2013-05-28 NOTE — Transfer of Care (Signed)
Immediate Anesthesia Transfer of Care Note  Patient: Melissa Beck  Procedure(s) Performed: Procedure(s): LAPAROSCOPIC TUBAL LIGATION WITH CAUTERIZATION (Bilateral)  Patient Location: PACU  Anesthesia Type:General  Level of Consciousness: awake, oriented, sedated and patient cooperative  Airway & Oxygen Therapy: Patient Spontanous Breathing and Patient connected to nasal cannula oxygen  Post-op Assessment: Report given to PACU RN and Post -op Vital signs reviewed and stable  Post vital signs: Reviewed and stable  Complications: No apparent anesthesia complications

## 2013-05-28 NOTE — Anesthesia Postprocedure Evaluation (Signed)
Anesthesia Post Note  Patient: Melissa Beck  Procedure(s) Performed: Procedure(s) (LRB): LAPAROSCOPIC TUBAL LIGATION WITH CAUTERIZATION (Bilateral)  Anesthesia type: General  Patient location: PACU  Post pain: Pain level controlled  Post assessment: Post-op Vital signs reviewed  Last Vitals:  Filed Vitals:   05/28/13 1115  BP: 104/62  Pulse: 51  Temp:   Resp: 16    Post vital signs: Reviewed  Level of consciousness: sedated  Complications: No apparent anesthesia complications

## 2013-05-28 NOTE — Discharge Summary (Signed)
  Physician Discharge Summary  Patient ID: Melissa Beck MRN: [id number] DOB/AGE: [birth year] 35 y.o.  Admit date: 05/28/2013 Discharge date: 05/28/2013  Admission Diagnoses: Desires Sterilization  [phone]  Discharge Diagnoses: Desires Sterilization  [phone]        Active Problems:   * No active hospital problems. *   Discharged Condition: good  Hospital Course: outpatient  Consults: None  Treatments: surgery: Laparoscopic biltateral tubal sterilization by cautherization  Disposition: 01-Home or Self Care     Medication List         ibuprofen 600 MG tablet  Commonly known as:  ADVIL,MOTRIN  Take 600 mg by mouth every 6 (six) hours as needed for pain.     lanolin ointment  Apply 1 application topically as needed (for breastfeeding).     oxyCODONE-acetaminophen 7.5-325 MG per tablet  Commonly known as:  PERCOCET  Take 1 tablet by mouth every 4 (four) hours as needed for pain.     prenatal multivitamin Tabs tablet  Take 1 tablet by mouth at bedtime.     SLOW FE PO  Take 1 tablet by mouth daily.           Follow-up Information   Follow up with Addley Ballinger,MARIE-LYNE, MD In 3 weeks.   Specialty:  Obstetrics and Gynecology   Contact information:   [address] [place] [address] [phone]       Signed: Genia Del, MD 05/28/2013, 10:35 AM

## 2013-05-28 NOTE — Op Note (Signed)
05/28/2013  10:22 AM  PATIENT:  Melissa Beck  37 y.o. female  PRE-OPERATIVE DIAGNOSIS:  Desires Sterilization    POST-OPERATIVE DIAGNOSIS:  Desires Sterilization  PROCEDURE:  Procedure(s): LAPAROSCOPIC BILATERAL TUBAL LIGATION WITH CAUTERIZATION  SURGEON:  Surgeon(s): Genia Del, MD  ASSISTANTS: none   ANESTHESIA:   general  PROCEDURE:  Under general anesthesia with laryngeal mask the patient is in lithotomy position. She is prepped with ChloraPrep on the abdomen and with Betadine on the suprapubic, vulvar and vaginal areas. The bladder is catheterized. Vaginally the speculum is inserted and the uterus was cannulated with a 1 tooth tenaculum cannula.  The speculum was removed. The infraumbilical area is infiltrated with Marcaine one quarter plain. A 1.5 cm incision is done with the scalpel. We opened the aponeurosis with Mayo scissors under direct vision. We opened the peritoneum bluntly with a finger. A pursestring stitch of Vicryl 0 is done on the aponeurosis. We insert the [place] under direct vision. A pillow peritoneum is created with CO2. The operative camera is inserted at that level.  Inspection of the pelvic cavity revealed a normal uterus 2 normal tubes and normal ovaries. No pathology is seen in the pelvis. The appendix is seen and is normal in appearance. The liver is smooth and normal in appearance. Pictures are taken of although structures. We used the Kleppinger with bipolar current to cauterize the right tube at about 2 cm from the cornua including the mesosalpinx. We go distally and cauterize the tube at that level and then cauterize in between. We proceed exactly the same way on the left side.  Hemostasis is adequate at all levels. Petrizzo taken off her tubal sterilization by cauterization. Instruments are removed.  The [place] port is removed. The CO2 is evacuated.  We attached a pursestring stitch of the aponeurosis. We then complete hemostasis with the Bovie at the  subcutaneous tissue.  We then closed the skin with a subcuticular stitch of Vicryl 4-0. Dermabond was added. The cannula was removed from the uterus. Hemostasis is adequate at that level as well. The patient is brought to recovery room in good and stable status.  ESTIMATED BLOOD LOSS: 10 CC   Intake/Output Summary (Last 24 hours) at 05/28/13 1022 Last data filed at 05/28/13 0957  Gross per 24 hour  Intake      0 ml  Output     60 ml  Net    -60 ml     BLOOD ADMINISTERED:none   LOCAL MEDICATIONS USED:  MARCAINE     SPECIMEN:  No Specimen  DISPOSITION OF SPECIMEN:  N/A  COUNTS:  YES  PLAN OF CARE: Transfer to PACU  SURGEON:  Surgeon(s): Genia Del, MD    05/28/13 AT 10:33 AM

## 2013-05-31 ENCOUNTER — Encounter (HOSPITAL_COMMUNITY): Payer: Self-pay | Admitting: Obstetrics & Gynecology

## 2013-12-25 ENCOUNTER — Encounter (HOSPITAL_COMMUNITY): Payer: BC Managed Care – PPO | Admitting: Certified Registered Nurse Anesthetist

## 2013-12-25 ENCOUNTER — Emergency Department (HOSPITAL_COMMUNITY): Payer: BC Managed Care – PPO | Admitting: Certified Registered Nurse Anesthetist

## 2013-12-25 ENCOUNTER — Encounter (HOSPITAL_COMMUNITY)
Admission: EM | Disposition: A | Discharge status: Home / Self Care | Payer: Self-pay | Source: Home / Self Care | Attending: Emergency Medicine

## 2013-12-25 ENCOUNTER — Observation Stay (HOSPITAL_COMMUNITY)
Admission: EM | Admit: 2013-12-25 | Discharge: 2013-12-25 | Disposition: A | Discharge status: Home / Self Care | Payer: BC Managed Care – PPO | Attending: Emergency Medicine | Admitting: Emergency Medicine

## 2013-12-25 ENCOUNTER — Emergency Department (HOSPITAL_COMMUNITY): Payer: BC Managed Care – PPO

## 2013-12-25 ENCOUNTER — Encounter (HOSPITAL_COMMUNITY): Payer: Self-pay | Admitting: Emergency Medicine

## 2013-12-25 DIAGNOSIS — D649 Anemia, unspecified: Secondary | ICD-10-CM | POA: Insufficient documentation

## 2013-12-25 DIAGNOSIS — R509 Fever, unspecified: Secondary | ICD-10-CM | POA: Insufficient documentation

## 2013-12-25 DIAGNOSIS — N1330 Unspecified hydronephrosis: Secondary | ICD-10-CM | POA: Insufficient documentation

## 2013-12-25 DIAGNOSIS — E669 Obesity, unspecified: Secondary | ICD-10-CM | POA: Insufficient documentation

## 2013-12-25 DIAGNOSIS — N201 Calculus of ureter: Principal | ICD-10-CM | POA: Insufficient documentation

## 2013-12-25 DIAGNOSIS — N200 Calculus of kidney: Secondary | ICD-10-CM

## 2013-12-25 DIAGNOSIS — Z87891 Personal history of nicotine dependence: Secondary | ICD-10-CM | POA: Insufficient documentation

## 2013-12-25 HISTORY — PX: CYSTOSCOPY WITH STENT PLACEMENT: SHX5790

## 2013-12-25 LAB — CBC WITH DIFFERENTIAL/PLATELET
BASOS ABS: 0 10*3/uL (ref 0.0–0.1)
Basophils Relative: 0 % (ref 0–1)
EOS ABS: 0 10*3/uL (ref 0.0–0.7)
EOS PCT: 0 % (ref 0–5)
HCT: 36.8 % (ref 36.0–46.0)
Hemoglobin: 12.5 g/dL (ref 12.0–15.0)
LYMPHS ABS: 0.9 10*3/uL (ref 0.7–4.0)
Lymphocytes Relative: 8 % — ABNORMAL LOW (ref 12–46)
MCH: 28.1 pg (ref 26.0–34.0)
MCHC: 34 g/dL (ref 30.0–36.0)
MCV: 82.7 fL (ref 78.0–100.0)
Monocytes Absolute: 0.9 10*3/uL (ref 0.1–1.0)
Monocytes Relative: 8 % (ref 3–12)
Neutro Abs: 8.7 10*3/uL — ABNORMAL HIGH (ref 1.7–7.7)
Neutrophils Relative %: 84 % — ABNORMAL HIGH (ref 43–77)
Platelets: 181 10*3/uL (ref 150–400)
RBC: 4.45 MIL/uL (ref 3.87–5.11)
RDW: 13.4 % (ref 11.5–15.5)
WBC: 10.5 10*3/uL (ref 4.0–10.5)

## 2013-12-25 LAB — I-STAT CHEM 8, ED
BUN: 10 mg/dL (ref 6–23)
CREATININE: 1.2 mg/dL — AB (ref 0.50–1.10)
Calcium, Ion: 1.14 mmol/L (ref 1.12–1.23)
Chloride: 101 mEq/L (ref 96–112)
Glucose, Bld: 111 mg/dL — ABNORMAL HIGH (ref 70–99)
HCT: 37 % (ref 36.0–46.0)
HEMOGLOBIN: 12.6 g/dL (ref 12.0–15.0)
Potassium: 4 mEq/L (ref 3.7–5.3)
SODIUM: 137 meq/L (ref 137–147)
TCO2: 23 mmol/L (ref 0–100)

## 2013-12-25 LAB — URINE MICROSCOPIC-ADD ON

## 2013-12-25 LAB — URINALYSIS, ROUTINE W REFLEX MICROSCOPIC
Bilirubin Urine: NEGATIVE
Glucose, UA: NEGATIVE mg/dL
KETONES UR: 15 mg/dL — AB
NITRITE: NEGATIVE
Protein, ur: 100 mg/dL — AB
Specific Gravity, Urine: 1.028 (ref 1.005–1.030)
UROBILINOGEN UA: 1 mg/dL (ref 0.0–1.0)
pH: 5.5 (ref 5.0–8.0)

## 2013-12-25 LAB — POC URINE PREG, ED: Preg Test, Ur: NEGATIVE

## 2013-12-25 SURGERY — CYSTOSCOPY, WITH STENT INSERTION
Anesthesia: General | Site: Ureter | Laterality: Left

## 2013-12-25 MED ORDER — LIDOCAINE HCL (CARDIAC) 20 MG/ML IV SOLN
INTRAVENOUS | Status: DC | PRN
  Administered 2013-12-25: 80 mg via INTRAVENOUS

## 2013-12-25 MED ORDER — LACTATED RINGERS IV SOLN
INTRAVENOUS | Status: DC | PRN
  Administered 2013-12-25: 14:00:00 via INTRAVENOUS

## 2013-12-25 MED ORDER — HYDROMORPHONE HCL PF 1 MG/ML IJ SOLN: 0.2500 mg | INTRAMUSCULAR | Status: DC | PRN

## 2013-12-25 MED ORDER — ONDANSETRON HCL 4 MG/2ML IJ SOLN
INTRAMUSCULAR | Status: DC | PRN
  Administered 2013-12-25: 4 mg via INTRAVENOUS

## 2013-12-25 MED ORDER — MEPERIDINE HCL 50 MG/ML IJ SOLN
6.2500 mg | INTRAMUSCULAR | Status: DC | PRN
Start: 2013-12-25 — End: 2013-12-25

## 2013-12-25 MED ORDER — LIDOCAINE HCL 2 % EX GEL
CUTANEOUS | Status: AC
  Filled 2013-12-25: qty 10

## 2013-12-25 MED ORDER — ACETAMINOPHEN-CODEINE #3 300-30 MG PO TABS: 1.0000 | ORAL_TABLET | ORAL | Status: DC | PRN

## 2013-12-25 MED ORDER — ACETAMINOPHEN 10 MG/ML IV SOLN
1000.0000 mg | Freq: Once | INTRAVENOUS | Status: AC
  Administered 2013-12-25: 1000 mg via INTRAVENOUS
  Filled 2013-12-25 (×2): qty 100

## 2013-12-25 MED ORDER — ONDANSETRON HCL 4 MG/2ML IJ SOLN
4.0000 mg | Freq: Once | INTRAMUSCULAR | Status: AC
  Administered 2013-12-25: 4 mg via INTRAVENOUS
  Filled 2013-12-25: qty 2

## 2013-12-25 MED ORDER — KETOROLAC TROMETHAMINE 30 MG/ML IJ SOLN
INTRAMUSCULAR | Status: DC | PRN
  Administered 2013-12-25: 30 mg via INTRAVENOUS

## 2013-12-25 MED ORDER — METOCLOPRAMIDE HCL 5 MG/ML IJ SOLN
INTRAMUSCULAR | Status: AC
  Filled 2013-12-25: qty 2

## 2013-12-25 MED ORDER — GENTAMICIN SULFATE 40 MG/ML IJ SOLN
370.0000 mg | Freq: Once | INTRAMUSCULAR | Status: AC
  Administered 2013-12-25: 370 mg via INTRAVENOUS
  Filled 2013-12-25: qty 9.25

## 2013-12-25 MED ORDER — METOCLOPRAMIDE HCL 5 MG/ML IJ SOLN
10.0000 mg | Freq: Once | INTRAMUSCULAR | Status: AC | PRN
  Administered 2013-12-25: 10 mg via INTRAVENOUS

## 2013-12-25 MED ORDER — MORPHINE SULFATE 4 MG/ML IJ SOLN
4.0000 mg | Freq: Once | INTRAMUSCULAR | Status: AC
  Administered 2013-12-25: 4 mg via INTRAVENOUS
  Filled 2013-12-25: qty 1

## 2013-12-25 MED ORDER — TROSPIUM CHLORIDE ER 60 MG PO CP24: 60.0000 mg | ORAL_CAPSULE | Freq: Every day | ORAL | Status: DC

## 2013-12-25 MED ORDER — GENTAMICIN SULFATE 40 MG/ML IJ SOLN
5.0000 mg/kg | Freq: Once | INTRAMUSCULAR | Status: DC
  Filled 2013-12-25: qty 11.75

## 2013-12-25 MED ORDER — SODIUM CHLORIDE 0.9 % IV BOLUS (SEPSIS)
1000.0000 mL | Freq: Once | INTRAVENOUS | Status: AC
  Administered 2013-12-25: 1000 mL via INTRAVENOUS

## 2013-12-25 MED ORDER — BELLADONNA ALKALOIDS-OPIUM 16.2-60 MG RE SUPP
RECTAL | Status: DC | PRN
  Administered 2013-12-25: 1 via RECTAL

## 2013-12-25 MED ORDER — CEFAZOLIN SODIUM-DEXTROSE 2-3 GM-% IV SOLR
INTRAVENOUS | Status: DC | PRN
  Administered 2013-12-25: 2 g via INTRAVENOUS

## 2013-12-25 MED ORDER — MIDAZOLAM HCL 5 MG/5ML IJ SOLN
INTRAMUSCULAR | Status: DC | PRN
  Administered 2013-12-25: 0.5 mg via INTRAVENOUS

## 2013-12-25 MED ORDER — PHENAZOPYRIDINE HCL 200 MG PO TABS: 200.0000 mg | ORAL_TABLET | Freq: Three times a day (TID) | ORAL | Status: DC | PRN

## 2013-12-25 MED ORDER — CEFAZOLIN SODIUM-DEXTROSE 2-3 GM-% IV SOLR
INTRAVENOUS | Status: AC
  Filled 2013-12-25: qty 50

## 2013-12-25 MED ORDER — HYDROMORPHONE HCL PF 1 MG/ML IJ SOLN
1.0000 mg | Freq: Once | INTRAMUSCULAR | Status: AC
  Administered 2013-12-25: 1 mg via INTRAVENOUS
  Filled 2013-12-25: qty 1

## 2013-12-25 MED ORDER — PROPOFOL 10 MG/ML IV BOLUS
INTRAVENOUS | Status: AC
  Filled 2013-12-25: qty 20

## 2013-12-25 MED ORDER — KETOROLAC TROMETHAMINE 30 MG/ML IJ SOLN
INTRAMUSCULAR | Status: AC
  Filled 2013-12-25: qty 1

## 2013-12-25 MED ORDER — BELLADONNA ALKALOIDS-OPIUM 16.2-60 MG RE SUPP
RECTAL | Status: AC
  Filled 2013-12-25: qty 1

## 2013-12-25 MED ORDER — IOHEXOL 300 MG/ML  SOLN
INTRAMUSCULAR | Status: DC | PRN
  Administered 2013-12-25: 5 mL

## 2013-12-25 MED ORDER — LIDOCAINE HCL 2 % EX GEL
CUTANEOUS | Status: DC | PRN
  Administered 2013-12-25: 1

## 2013-12-25 MED ORDER — ONDANSETRON HCL 4 MG/2ML IJ SOLN
INTRAMUSCULAR | Status: AC
  Filled 2013-12-25: qty 2

## 2013-12-25 MED ORDER — PHENAZOPYRIDINE HCL 200 MG PO TABS
200.0000 mg | ORAL_TABLET | Freq: Once | ORAL | Status: AC
  Administered 2013-12-25: 200 mg via ORAL

## 2013-12-25 MED ORDER — LIDOCAINE HCL (CARDIAC) 20 MG/ML IV SOLN
INTRAVENOUS | Status: AC
  Filled 2013-12-25: qty 5

## 2013-12-25 MED ORDER — SODIUM CHLORIDE 0.9 % IR SOLN
Status: DC | PRN
  Administered 2013-12-25: 3000 mL

## 2013-12-25 MED ORDER — DEXAMETHASONE SODIUM PHOSPHATE 10 MG/ML IJ SOLN
INTRAMUSCULAR | Status: DC | PRN
  Administered 2013-12-25: 10 mg via INTRAVENOUS

## 2013-12-25 MED ORDER — MIDAZOLAM HCL 2 MG/2ML IJ SOLN
INTRAMUSCULAR | Status: AC
  Filled 2013-12-25: qty 2

## 2013-12-25 MED ORDER — FENTANYL CITRATE 0.05 MG/ML IJ SOLN
INTRAMUSCULAR | Status: DC | PRN
  Administered 2013-12-25 (×2): 25 ug via INTRAVENOUS

## 2013-12-25 MED ORDER — PHENAZOPYRIDINE HCL 200 MG PO TABS
ORAL_TABLET | ORAL | Status: AC
  Filled 2013-12-25: qty 1

## 2013-12-25 MED ORDER — FENTANYL CITRATE 0.05 MG/ML IJ SOLN
INTRAMUSCULAR | Status: AC
  Filled 2013-12-25: qty 5

## 2013-12-25 MED ORDER — GENTAMICIN SULFATE 40 MG/ML IJ SOLN
160.0000 mg | Freq: Once | INTRAVENOUS | Status: DC
  Filled 2013-12-25: qty 4

## 2013-12-25 MED ORDER — PROPOFOL 10 MG/ML IV BOLUS
INTRAVENOUS | Status: DC | PRN
  Administered 2013-12-25: 170 mg via INTRAVENOUS

## 2013-12-25 SURGICAL SUPPLY — 4 items
CATH INTERMIT  6FR 70CM (CATHETERS) IMPLANT
CATH URET 5FR 28IN OPEN ENDED (CATHETERS) ×3 IMPLANT
PACK CYSTO (CUSTOM PROCEDURE TRAY) ×3 IMPLANT
STENT CONTOUR 6FRX26X.038 (STENTS) ×3 IMPLANT

## 2013-12-25 NOTE — ED Notes (Signed)
Melissa Beck CN spoke with OR. Pt being transferred to OR at present time.

## 2013-12-25 NOTE — ED Notes (Signed)
Pt reports left sided flank pain with recent/current treatment for UTI. Pt reports n/v. Pt reports normal BM this am. Pt denies blood in stool/vomit.  Pt denies other urinary symptoms besides pain, nausea, and vomiting.

## 2013-12-25 NOTE — Anesthesia Postprocedure Evaluation (Signed)
  Anesthesia Post-op Note  Patient: Melissa Beck  Procedure(s) Performed: Procedure(s): CYSTOSCOPY WITH STENT PLACEMENT (Left)  Patient Location: PACU  Anesthesia Type:General  Level of Consciousness: awake, alert  and oriented  Airway and Oxygen Therapy: Patient Spontanous Breathing  Post-op Pain: mild  Post-op Assessment: Post-op Vital signs reviewed, Patient's Cardiovascular Status Stable, Respiratory Function Stable, Patent Airway, No signs of Nausea or vomiting and Pain level controlled  Post-op Vital Signs: Reviewed and stable  Complications: No apparent anesthesia complications

## 2013-12-25 NOTE — ED Notes (Signed)
Belfi MD made aware of pt current pain status.

## 2013-12-25 NOTE — ED Provider Notes (Signed)
CSN: [id number]     Arrival date & time 12/25/13  0932 History   First MD Initiated Contact with Patient 12/25/13 [phone]     Chief Complaint  Patient presents with  . Flank Pain  . Emesis     (Consider location/radiation/quality/duration/timing/severity/associated sxs/prior Treatment) HPI Comments: Patient has left-sided flank pain. She states she's had some soreness in pain to her left back radiating to her left midabdomen for about the last 5 days. She was seen 4 days ago by her primary care physician at [facility] and was diagnosed with a UTI. She was started on Cipro. She states she thought the pain started getting better the next day however today it's come back again and is more severe. It does wax and wane in intensity. She's had some nausea and vomiting this morning. She did have a fever of 102 but hasn't had a fever in the last 2 days. She denies any urinary symptoms or gross hematuria. She denies any vaginal bleeding or discharge.  Patient is a 38 y.o. female presenting with flank pain and vomiting.  Flank Pain Associated symptoms include abdominal pain. Pertinent negatives include no chest pain, no headaches and no shortness of breath.  Emesis Associated symptoms: abdominal pain   Associated symptoms: no arthralgias, no chills, no diarrhea and no headaches     Past Medical History  Diagnosis Date  . Abnormal Pap smear   . History of placenta abruption   . AMA (advanced maternal age) multigravida 35+   . Tobacco use disorder   . VBAC, delivered (10/6) 04/26/2013  . Postpartum care following vaginal delivery (10/6) 04/27/2013  . Anemia    Past Surgical History  Procedure Laterality Date  . Gynecologic cryosurgery    . Cesarean section    . Wisdom tooth extraction    . Laparoscopic tubal ligation Bilateral 05/28/2013    Procedure: LAPAROSCOPIC TUBAL LIGATION WITH CAUTERIZATION;  Surgeon: Genia Del, MD;  Location: [facility];  Service: Gynecology;  Laterality:  Bilateral;   Family History  Problem Relation Age of Onset  . COPD Mother   . Thrombocytopenia Daughter    History  Substance Use Topics  . Smoking status: Former Smoker -- 0.50 packs/day for 20 years    Quit date: 11/23/2012  . Smokeless tobacco: Not on file  . Alcohol Use: No   OB History   Grav Para Term Preterm Abortions TAB SAB Ect Mult Living   3 3 2 1      3      Review of Systems  Constitutional: Negative for fever, chills, diaphoresis and fatigue.  HENT: Negative for congestion, rhinorrhea and sneezing.   Eyes: Negative.   Respiratory: Negative for cough, chest tightness and shortness of breath.   Cardiovascular: Negative for chest pain and leg swelling.  Gastrointestinal: Positive for nausea, vomiting and abdominal pain. Negative for diarrhea and blood in stool.  Genitourinary: Positive for flank pain. Negative for frequency, hematuria, vaginal bleeding, vaginal discharge and difficulty urinating.  Musculoskeletal: Negative for arthralgias and back pain.  Skin: Negative for rash.  Neurological: Negative for dizziness, speech difficulty, weakness, numbness and headaches.      Allergies  Review of patient's allergies indicates no known allergies.  Home Medications   Prior to Admission medications   Medication Sig Start Date End Date Taking? Authorizing Provider  ciprofloxacin (CIPRO) 500 MG tablet Take 500 mg by mouth 2 (two) times daily. For 10 days 12/23/13  Yes Historical Provider, MD  ibuprofen (ADVIL,MOTRIN) 200 MG tablet  Take 400 mg by mouth every 6 (six) hours as needed for moderate pain.   Yes Historical Provider, MD   BP 129/78  Pulse 102  Temp(Src) 99.4 F (37.4 C) (Oral)  Resp 20  SpO2 98%  LMP 11/24/2013 Physical Exam  Constitutional: She is oriented to person, place, and time. She appears well-developed and well-nourished.  HENT:  Head: Normocephalic and atraumatic.  Eyes: Pupils are equal, round, and reactive to light.  Neck: Normal range of  motion. Neck supple.  Cardiovascular: Normal rate, regular rhythm and normal heart sounds.   Pulmonary/Chest: Effort normal and breath sounds normal. No respiratory distress. She has no wheezes. She has no rales. She exhibits no tenderness.  Abdominal: Soft. Bowel sounds are normal. There is tenderness (moderate tenderness to the left midabdomen and left CVA tenderness). There is no rebound and no guarding.  Musculoskeletal: Normal range of motion. She exhibits no edema.  Lymphadenopathy:    She has no cervical adenopathy.  Neurological: She is alert and oriented to person, place, and time.  Skin: Skin is warm and dry. No rash noted.  Psychiatric: She has a normal mood and affect.    ED Course  Procedures (including critical care time) Labs Review Results for orders placed during the hospital encounter of 12/25/13  URINALYSIS, ROUTINE W REFLEX MICROSCOPIC      Result Value Ref Range   Color, Urine YELLOW  YELLOW   APPearance TURBID (*) CLEAR   Specific Gravity, Urine 1.028  1.005 - 1.030   pH 5.5  5.0 - 8.0   Glucose, UA NEGATIVE  NEGATIVE mg/dL   Hgb urine dipstick SMALL (*) NEGATIVE   Bilirubin Urine NEGATIVE  NEGATIVE   Ketones, ur 15 (*) NEGATIVE mg/dL   Protein, ur 161100 (*) NEGATIVE mg/dL   Urobilinogen, UA 1.0  0.0 - 1.0 mg/dL   Nitrite NEGATIVE  NEGATIVE   Leukocytes, UA MODERATE (*) NEGATIVE  CBC WITH DIFFERENTIAL      Result Value Ref Range   WBC 10.5  4.0 - 10.5 K/uL   RBC 4.45  3.87 - 5.11 MIL/uL   Hemoglobin 12.5  12.0 - 15.0 g/dL   HCT 09.636.8  04.536.0 - 40.946.0 %   MCV 82.7  78.0 - 100.0 fL   MCH 28.1  26.0 - 34.0 pg   MCHC 34.0  30.0 - 36.0 g/dL   RDW 81.113.4  91.411.5 - 78.215.5 %   Platelets 181  150 - 400 K/uL   Neutrophils Relative % 84 (*) 43 - 77 %   Neutro Abs 8.7 (*) 1.7 - 7.7 K/uL   Lymphocytes Relative 8 (*) 12 - 46 %   Lymphs Abs 0.9  0.7 - 4.0 K/uL   Monocytes Relative 8  3 - 12 %   Monocytes Absolute 0.9  0.1 - 1.0 K/uL   Eosinophils Relative 0  0 - 5 %    Eosinophils Absolute 0.0  0.0 - 0.7 K/uL   Basophils Relative 0  0 - 1 %   Basophils Absolute 0.0  0.0 - 0.1 K/uL  URINE MICROSCOPIC-ADD ON      Result Value Ref Range   Squamous Epithelial / LPF MANY (*) RARE   WBC, UA 11-20  <3 WBC/hpf   RBC / HPF 0-2  <3 RBC/hpf   Bacteria, UA MANY (*) RARE   Urine-Other MUCOUS PRESENT    POC URINE PREG, ED      Result Value Ref Range   Preg Test, Ur NEGATIVE  NEGATIVE  I-STAT CHEM 8, ED      Result Value Ref Range   Sodium 137  137 - 147 mEq/L   Potassium 4.0  3.7 - 5.3 mEq/L   Chloride 101  96 - 112 mEq/L   BUN 10  6 - 23 mg/dL   Creatinine, Ser 1.61 (*) 0.50 - 1.10 mg/dL   Glucose, Bld 096 (*) 70 - 99 mg/dL   Calcium, Ion 0.45  4.09 - 1.23 mmol/L   TCO2 23  0 - 100 mmol/L   Hemoglobin 12.6  12.0 - 15.0 g/dL   HCT 81.1  91.4 - 78.2 %   Ct Abdomen Pelvis Wo Contrast  12/25/2013   CLINICAL DATA:  Left-sided flank pain.  History of tubal ligation.  EXAM: CT ABDOMEN AND PELVIS WITHOUT CONTRAST  TECHNIQUE: Multidetector CT imaging of the abdomen and pelvis was performed following the standard protocol without IV contrast.  COMPARISON:  No priors.  FINDINGS: Lung Bases: Unremarkable.  Abdomen/Pelvis: Image 77 of series 2 demonstrates a 5 mm calculus in the distal third of the left ureter shortly before the left ureterovesicular junction. This is associated with mild to moderate proximal hydroureteronephrosis and extensive perinephric stranding, compatible with acute obstruction. No additional calculi are noted within the collecting system of either kidney, along the course of the right ureter, or within the lumen of the urinary bladder. The unenhanced appearance of the kidneys is otherwise unremarkable bilaterally.  The unenhanced appearance of the liver, gallbladder, pancreas and bilateral adrenal glands is unremarkable. The spleen appears mildly enlarged measuring 14.1 x 8.2 x 9.0 cm (estimated splenic volume of 520 mL). Left-sided inferior vena cava  (normal anatomical variant) incidentally noted. Trace volume of ascites in the cul-de-sac may be reactive to the obstruction, or could be physiologic in this young female patient. Uterus and ovaries are unremarkable in appearance. No pneumoperitoneum. No pathologic distention of small bowel. No lymphadenopathy identified within the abdomen or pelvis on today's non contrast CT examination. Normal appendix.  Musculoskeletal: There are no aggressive appearing lytic or blastic lesions noted in the visualized portions of the skeleton.  IMPRESSION: 1. 5 mm obstructive calculus in the distal third of the left ureter shortly before the left ureterovesicular junction with mild to moderate proximal hydroureteronephrosis and extensive left-sided perinephric stranding. 2. Mild splenomegaly. 3. Normal appendix.   Electronically Signed   By: Trudie Reed M.D.   On: 12/25/2013 11:52     Imaging Review Ct Abdomen Pelvis Wo Contrast  12/25/2013   CLINICAL DATA:  Left-sided flank pain.  History of tubal ligation.  EXAM: CT ABDOMEN AND PELVIS WITHOUT CONTRAST  TECHNIQUE: Multidetector CT imaging of the abdomen and pelvis was performed following the standard protocol without IV contrast.  COMPARISON:  No priors.  FINDINGS: Lung Bases: Unremarkable.  Abdomen/Pelvis: Image 77 of series 2 demonstrates a 5 mm calculus in the distal third of the left ureter shortly before the left ureterovesicular junction. This is associated with mild to moderate proximal hydroureteronephrosis and extensive perinephric stranding, compatible with acute obstruction. No additional calculi are noted within the collecting system of either kidney, along the course of the right ureter, or within the lumen of the urinary bladder. The unenhanced appearance of the kidneys is otherwise unremarkable bilaterally.  The unenhanced appearance of the liver, gallbladder, pancreas and bilateral adrenal glands is unremarkable. The spleen appears mildly enlarged  measuring 14.1 x 8.2 x 9.0 cm (estimated splenic volume of 520 mL). Left-sided inferior vena cava (normal anatomical variant) incidentally noted.  Trace volume of ascites in the cul-de-sac may be reactive to the obstruction, or could be physiologic in this young female patient. Uterus and ovaries are unremarkable in appearance. No pneumoperitoneum. No pathologic distention of small bowel. No lymphadenopathy identified within the abdomen or pelvis on today's non contrast CT examination. Normal appendix.  Musculoskeletal: There are no aggressive appearing lytic or blastic lesions noted in the visualized portions of the skeleton.  IMPRESSION: 1. 5 mm obstructive calculus in the distal third of the left ureter shortly before the left ureterovesicular junction with mild to moderate proximal hydroureteronephrosis and extensive left-sided perinephric stranding. 2. Mild splenomegaly. 3. Normal appendix.   Electronically Signed   By: Trudie Reed M.D.   On: 12/25/2013 11:52     EKG Interpretation None      MDM   Final diagnoses:  Kidney stone    Patient is 5 mm obstructive left ureteral stone. She has a mild elevation in her creatinine. She's been having fevers of 202 yesterday. She has no fever in the ED today. She has been on Cipro which he did take today. I did send her urine for culture. I spoke with Dr. Marlou Porch with urology who will see the patient and possibly put a stent in. She possibly will go home after that.    Rolan Bucco, MD 12/25/13 1245

## 2013-12-25 NOTE — H&P (Signed)
Cc:  Left flank pain, fever  History of present illness: Is a 38 year old female who has had left-sided flank pain for the past 4 days. She presented to her primary care doctor with fever and was treated for urinary tract infection. Her fever has been as high as 102. It was one of 101.4 at her doctor's office. She's been treated with ciprofloxacin for this. Her pain has not resolved and as such he presented to the [facility] emergency department. She was worked up for her pain and received a noncontrast CT scan, she was found to have a 5 mm left distal ureteral stone, with proximal hydronephrosis. The patient is complaining of severe left flank pain.  Pain is radiating to her left lower quadrant.  She denies any dysuria or hematuria.  She is otherwise healthy with no significant past medical history.  Review of systems: A 12 point comprehensive review of systems was obtained and is negative unless otherwise stated in the history of present illness.  Patient Active Problem List   Diagnosis Date Noted  . Postpartum care following vaginal delivery (10/6) 04/27/2013  . VBAC, delivered (10/6) 04/26/2013  . Previous cesarean section - tolac 04/25/2013    No current facility-administered medications on file prior to encounter.   No current outpatient prescriptions on file prior to encounter.    Past Medical History  Diagnosis Date  . Abnormal Pap smear   . History of placenta abruption   . AMA (advanced maternal age) multigravida 35+   . Tobacco use disorder   . VBAC, delivered (10/6) 04/26/2013  . Postpartum care following vaginal delivery (10/6) 04/27/2013  . Anemia     Past Surgical History  Procedure Laterality Date  . Gynecologic cryosurgery    . Cesarean section    . Wisdom tooth extraction    . Laparoscopic tubal ligation Bilateral 05/28/2013    Procedure: LAPAROSCOPIC TUBAL LIGATION WITH CAUTERIZATION;  Surgeon: Genia Del, MD;  Location: [facility];  Service: Gynecology;   Laterality: Bilateral;    History  Substance Use Topics  . Smoking status: Former Smoker -- 0.50 packs/day for 20 years    Quit date: 11/23/2012  . Smokeless tobacco: Not on file  . Alcohol Use: No    Family History  Problem Relation Age of Onset  . COPD Mother   . Thrombocytopenia Daughter     PE: Filed Vitals:   12/25/13 0946 12/25/13 1138 12/25/13 1310  BP: 140/89 129/78 117/67  Pulse: 116 102 88  Temp: 99.4 F (37.4 C) 99.4 F (37.4 C) 99.8 F (37.7 C)  TempSrc: Oral Oral Oral  Resp: 18 20 16   SpO2: 100% 98% 96%   Patient appears to be in no acute distress  patient is alert and oriented x3 Atraumatic normocephalic head No cervical or supraclavicular lymphadenopathy appreciated No increased work of breathing, no audible wheezes/rhonchi Tachycardia sinus rhythm/rate Abdomen is soft, nontender, nondistended, no CVA or suprapubic tenderness Lower extremities are symmetric without appreciable edema Grossly neurologically intact No identifiable skin lesions   Recent Labs  12/25/13 1027 12/25/13 1052  WBC 10.5  --   HGB 12.5 12.6  HCT 36.8 37.0    Recent Labs  12/25/13 1052  NA 137  K 4.0  CL 101  GLUCOSE 111*  BUN 10  CREATININE 1.20*   No results found for this basename: LABPT, INR,  in the last 72 hours No results found for this basename: LABURIN,  in the last 72 hours Results for orders placed during  the hospital encounter of 04/25/13  SURGICAL PCR SCREEN     Status: Abnormal   Collection Time    04/27/13 12:18 AM      Result Value Ref Range Status   MRSA, PCR NEGATIVE  NEGATIVE Final   Staphylococcus aureus POSITIVE (*) NEGATIVE Final   Comment:            The Xpert SA Assay (FDA     approved for NASAL specimens     in patients over 36 years of age),     is one component of     a comprehensive surveillance     program.  Test performance has     been validated by [facility] for patients greater     than or equal to 612 year old.      It is not intended     to diagnose infection nor to     guide or monitor treatment.     RESULT CALLED TO, READ BACK BY AND VERIFIED WITH:     HARGRAVE AT 0152 ON Apr 27 2013 BY HOUEGNIFIO M    Imaging: CT scan, abdomen/pelvis, noncontrast: The patient has a 5 mm distal ureteral stone in the left ureter with proximal hydronephrosis.  Imp: Left renal colic with a 5 mm shucking stone in the distal ureter with an associated fever.  Recommendations: Aware of the implications of the patient's obstructing stone with associated fever, and I recommended the patient proceed to the operating room for a left ureteral stent placement. I discussed the risks and benefits of this procedure with the patient. She understands that she is at risk for developing bacteremia and may require hospitalization. The patient has agreed to proceed with the operation.   Crist FatBenjamin W Herrick

## 2013-12-25 NOTE — Transfer of Care (Signed)
Immediate Anesthesia Transfer of Care Note  Patient: Melissa Beck  Procedure(s) Performed: Procedure(s) (LRB): CYSTOSCOPY WITH STENT PLACEMENT (Left)  Patient Location: PACU  Anesthesia Type: General  Level of Consciousness: sedated, patient cooperative and responds to stimulation  Airway & Oxygen Therapy: Patient Spontanous Breathing and Patient connected to face mask oxgen  Post-op Assessment: Report given to PACU RN and Post -op Vital signs reviewed and stable  Post vital signs: Reviewed and stable  Complications: No apparent anesthesia complications

## 2013-12-25 NOTE — Discharge Instructions (Addendum)
DISCHARGE INSTRUCTIONS FOR KIDNEY STONE/URETERAL STENT   MEDICATIONS:  1.  Resume all your other meds from home - except do not take any extra narcotic pain meds that you may have at home.  2. Trospium is to prevent bladder spasms and help reduce urinary frequency. 3. Pyridium is to help with the burning/stinging when you urinate. 4. Tylenol with codeine is for moderate/severe pain, otherwise taking upto 1000mg  every 6 hours of plainTylenol will help treat your pain.  Do not take both at the same time.   ACTIVITY:  1. No strenuous activity x 1week  2. No driving while on narcotic pain medications  3. Drink plenty of water  4. Continue to walk at home - you can still get blood clots when you are at home, so keep active, but don't over do it.  5. May return to work/school tomorrow or when you feel ready   BATHING:  1. You can shower and we recommend daily showers     SIGNS/SYMPTOMS TO CALL:  Please call us if you have a fever greater than 101.5, uncontrolled nausea/vomiting, uncontrolled pain, dizziness, unable to urinate, bloody urine, chest pain, shortness of breath, leg swelling, leg pain, redness around wound, drainage from wound, or any other concerns or questions.   You can reach [place] at [phone].   FOLLOW-UP:  You will be scheduled for followup at [facility] at the end of next week for followup regarding her kidney stone.  General Anesthesia, Adult, Care After Refer to this sheet in the next few weeks. These instructions provide you with information on caring for yourself after your procedure. Your health care provider may also give you more specific instructions. Your treatment has been planned according to current medical practices, but problems sometimes occur. Call your health care provider if you have any problems or questions after your procedure. WHAT TO EXPECT AFTER THE PROCEDURE After the procedure, it is typical to experience:  Sleepiness.  Nausea and  vomiting. HOME CARE INSTRUCTIONS  For the first 24 hours after general anesthesia:  Have a responsible person with you.  Do not drive a car. If you are alone, do not take public transportation.  Do not drink alcohol.  Do not take medicine that has not been prescribed by your health care provider.  Do not sign important papers or make important decisions.  You may resume a normal diet and activities as directed by your health care provider.  Change bandages (dressings) as directed.  If you have questions or problems that seem related to general anesthesia, call the hospital and ask for the anesthetist or anesthesiologist on call. SEEK MEDICAL CARE IF:  You have nausea and vomiting that continue the day after anesthesia.  You develop a rash. SEEK IMMEDIATE MEDICAL CARE IF:   You have difficulty breathing.  You have chest pain.  You have any allergic problems. Document Released: 10/14/2000 Document Revised: 03/10/2013 Document Reviewed: 01/21/2013 [facility] Patient Information 2014 [place], [place].

## 2013-12-25 NOTE — ED Notes (Signed)
Pt has been having flank pain to left side.  Recently placed on antibiotics for uti.  Pt states she also thought she had some constipation but had 2 bm today.  Vomiting this am.

## 2013-12-25 NOTE — Op Note (Signed)
Preoperative diagnosis:  1. Left obstructing stone with infection   Postoperative diagnosis:  1. same   Procedure:  1. Cystoscopy 2. left ureteral stent placement 3. left retrograde pyelography with interpretation   Surgeon: Crist Fat, MD  Anesthesia: General  Complications: None  Intraoperative findings: significant hydronephrosis in left renal pelvis down to distal ureter.  EBL: Minimal  Specimens: urine culture obtained for left renal pelvis  Indication: Melissa Beck is a 38 y.o. patient with distal left ureteral stone with fevers and severe pain. After reviewing the management options for treatment, he elected to proceed with the above surgical procedure(s). We have discussed the potential benefits and risks of the procedure, side effects of the proposed treatment, the likelihood of the patient achieving the goals of the procedure, and any potential problems that might occur during the procedure or recuperation. Informed consent has been obtained.  Description of procedure:  The patient was taken to the operating room and general anesthesia was induced.  The patient was placed in the dorsal lithotomy position, prepped and draped in the usual sterile fashion, and preoperative antibiotics were administered. A preoperative time-out was performed.   Cystourethroscopy was performed.  The patient's urethra was examined and was normal. The bladder was then systematically examined in its entirety. There was no evidence for any bladder tumors, stones, or other mucosal pathology.    Attention then turned to the lef tureteral orifice and a 0.38 sensor wire was advanced up to the renal pelvis, a ureteral catheter was advanced over the wire into the proximal ureter, and the wire was removed. 25 cc of straw-colored urine was then aspirated and sent to the lab for culture. 5 cc of Omnipaque contrast was injected through the ureteral catheter and a retrograde pyelogram was performed with  findings as dictated above.  A 0.38 sensor guidewire was then advanced up the left ureter into the renal pelvis under fluoroscopic guidance, the open-ended ureteral Julius Bowels was removed.  The stent was then loaded over the wire and advanced over the wire using Seldinger technique.  The stent was positioned appropriately under fluoroscopic and cystoscopic guidance.  The wire was then removed with an adequate stent curl noted in the renal pelvis as well as in the bladder.  The bladder was then emptied and the procedure ended.  The patient appeared to tolerate the procedure well and without complications.  The patient was able to be awakened and transferred to the recovery unit in satisfactory condition.    Crist Fat, M.D.

## 2013-12-25 NOTE — Anesthesia Preprocedure Evaluation (Signed)
Anesthesia Evaluation  Patient identified by MRN, date of birth, ID band Patient awake    Reviewed: Allergy & Precautions, H&P , NPO status , Patient's Chart, lab work & pertinent test results  Airway Mallampati: III TM Distance: >3 FB Neck ROM: Full    Dental no notable dental hx. (+) Teeth Intact   Pulmonary former smoker,  breath sounds clear to auscultation  Pulmonary exam normal       Cardiovascular negative cardio ROS  Rhythm:Regular Rate:Normal     Neuro/Psych PSYCHIATRIC DISORDERS negative neurological ROS     GI/Hepatic negative GI ROS, Neg liver ROS,   Endo/Other  Obesity  Renal/GU Renal diseaseRenal calculus  negative genitourinary   Musculoskeletal negative musculoskeletal ROS (+)   Abdominal (+) + obese,   Peds  Hematology  (+) anemia ,   Anesthesia Other Findings   Reproductive/Obstetrics negative OB ROS                           Anesthesia Physical Anesthesia Plan  ASA: II and emergent  Anesthesia Plan: General   Post-op Pain Management:    Induction: Intravenous  Airway Management Planned: LMA  Additional Equipment:   Intra-op Plan:   Post-operative Plan: Extubation in OR  Informed Consent: I have reviewed the patients History and Physical, chart, labs and discussed the procedure including the risks, benefits and alternatives for the proposed anesthesia with the patient or authorized representative who has indicated his/her understanding and acceptance.   Dental advisory given  Plan Discussed with: CRNA, Anesthesiologist and Surgeon  Anesthesia Plan Comments:         Anesthesia Quick Evaluation

## 2013-12-26 LAB — URINE CULTURE
Colony Count: NO GROWTH
Culture: NO GROWTH

## 2013-12-26 NOTE — Discharge Summary (Signed)
Date of admission: 12/25/2013  Date of discharge: 12/26/2013  Admission diagnosis: left obstructing stone  Discharge diagnosis: same  Secondary diagnoses:  Patient Active Problem List   Diagnosis Date Noted  . Postpartum care following vaginal delivery (10/6) 04/27/2013  . VBAC, delivered (10/6) 04/26/2013  . Previous cesarean section - tolac 04/25/2013    History and Physical: For full details, please see admission history and physical. Briefly, Melissa Beck is a 38 y.o. year old patient with left obstructing stone.   Hospital Course: Patient tolerated the procedure well.  She discharged from the PACU.  Her hospital course was uncomplicated.  On POD#0  she had met discharge criteria: was eating a regular diet, was up and ambulating independently,  pain was well controlled, was voiding without a catheter, and was ready to for discharge.   Laboratory values:   Recent Labs  12/25/13 1027 12/25/13 1052  WBC 10.5  --   HGB 12.5 12.6  HCT 36.8 37.0    Recent Labs  12/25/13 1052  NA 137  K 4.0  CL 101  GLUCOSE 111*  BUN 10  CREATININE 1.20*   No results found for this basename: LABPT, INR,  in the last 72 hours No results found for this basename: LABURIN,  in the last 72 hours Results for orders placed during the hospital encounter of 04/25/13  SURGICAL PCR SCREEN     Status: Abnormal   Collection Time    04/27/13 12:18 AM      Result Value Ref Range Status   MRSA, PCR NEGATIVE  NEGATIVE Final   Staphylococcus aureus POSITIVE (*) NEGATIVE Final   Comment:            The Xpert SA Assay (FDA     approved for NASAL specimens     in patients over 53 years of age),     is one component of     a comprehensive surveillance     program.  Test performance has     been validated by [employer] for patients greater     than or equal to 71 year old.     It is not intended     to diagnose infection nor to     guide or monitor treatment.     RESULT CALLED TO, READ BACK BY  AND VERIFIED WITH:     HARGRAVE AT [place] ON Apr 27 2013 BY HOUEGNIFIO M    Disposition: Home  Discharge instruction: The patient was provided with appropriate instructions and precautions.  Discharge medications:    Medication List         acetaminophen-codeine 300-30 MG per tablet  Commonly known as:  TYLENOL #3  Take 1 tablet by mouth every 4 (four) hours as needed.     ciprofloxacin 500 MG tablet  Commonly known as:  CIPRO  Take 500 mg by mouth 2 (two) times daily. For 10 days     ibuprofen 200 MG tablet  Commonly known as:  ADVIL,MOTRIN  Take 400 mg by mouth every 6 (six) hours as needed for moderate pain.     phenazopyridine 200 MG tablet  Commonly known as:  PYRIDIUM  Take 1 tablet (200 mg total) by mouth 3 (three) times daily as needed for pain.     Trospium Chloride 60 MG Cp24  Take 1 capsule (60 mg total) by mouth daily.        Followup:      Follow-up Information  Follow up with Ardis Hughs, MD In 1 week.   Specialty:  Urology   Contact information:   [address] [place] [address] [phone]

## 2013-12-27 ENCOUNTER — Encounter (HOSPITAL_COMMUNITY): Payer: Self-pay | Admitting: Urology

## 2013-12-27 LAB — URINE CULTURE
Colony Count: >=100000
Special Requests: NORMAL

## 2013-12-30 ENCOUNTER — Encounter (HOSPITAL_BASED_OUTPATIENT_CLINIC_OR_DEPARTMENT_OTHER): Payer: Self-pay | Admitting: *Deleted

## 2013-12-30 ENCOUNTER — Other Ambulatory Visit: Payer: Self-pay | Admitting: Urology

## 2013-12-30 NOTE — Progress Notes (Signed)
NPO AFTER MN. ARRIVE AT 0945. CURRENT LAB RESULTS IN CHART AND EPIC. MAY TAKE RX PAIN MED IF NEEDED AM DOS W/ SIPS OF WATER.

## 2013-12-31 ENCOUNTER — Encounter (HOSPITAL_BASED_OUTPATIENT_CLINIC_OR_DEPARTMENT_OTHER): Payer: BC Managed Care – PPO | Admitting: Anesthesiology

## 2013-12-31 ENCOUNTER — Encounter (HOSPITAL_BASED_OUTPATIENT_CLINIC_OR_DEPARTMENT_OTHER): Payer: Self-pay

## 2013-12-31 ENCOUNTER — Ambulatory Visit (HOSPITAL_BASED_OUTPATIENT_CLINIC_OR_DEPARTMENT_OTHER)
Admission: RE | Admit: 2013-12-31 | Discharge: 2013-12-31 | Disposition: A | Discharge status: Home / Self Care | Payer: BC Managed Care – PPO | Source: Ambulatory Visit | Attending: Urology | Admitting: Urology

## 2013-12-31 ENCOUNTER — Ambulatory Visit (HOSPITAL_BASED_OUTPATIENT_CLINIC_OR_DEPARTMENT_OTHER): Payer: BC Managed Care – PPO | Admitting: Anesthesiology

## 2013-12-31 ENCOUNTER — Encounter (HOSPITAL_BASED_OUTPATIENT_CLINIC_OR_DEPARTMENT_OTHER)
Admission: RE | Disposition: A | Discharge status: Home / Self Care | Payer: Self-pay | Source: Ambulatory Visit | Attending: Urology

## 2013-12-31 DIAGNOSIS — Z87891 Personal history of nicotine dependence: Secondary | ICD-10-CM | POA: Insufficient documentation

## 2013-12-31 DIAGNOSIS — N200 Calculus of kidney: Secondary | ICD-10-CM

## 2013-12-31 DIAGNOSIS — Z79899 Other long term (current) drug therapy: Secondary | ICD-10-CM | POA: Insufficient documentation

## 2013-12-31 DIAGNOSIS — N201 Calculus of ureter: Secondary | ICD-10-CM | POA: Insufficient documentation

## 2013-12-31 HISTORY — DX: Calculus of ureter: N20.1

## 2013-12-31 HISTORY — DX: Candidiasis of vulva and vagina: B37.3

## 2013-12-31 HISTORY — PX: CYSTOSCOPY WITH URETEROSCOPY, STONE BASKETRY AND STENT PLACEMENT: SHX6378

## 2013-12-31 HISTORY — DX: Frequency of micturition: R35.0

## 2013-12-31 HISTORY — DX: Urgency of urination: R39.15

## 2013-12-31 HISTORY — DX: Gastro-esophageal reflux disease without esophagitis: K21.9

## 2013-12-31 HISTORY — DX: Acute candidiasis of vulva and vagina: B37.31

## 2013-12-31 HISTORY — DX: Presence of spectacles and contact lenses: Z97.3

## 2013-12-31 HISTORY — DX: Personal history of other diseases of the female genital tract: Z87.42

## 2013-12-31 SURGERY — CYSTOSCOPY, WITH CALCULUS MANIPULATION OR REMOVAL
Anesthesia: General | Site: Ureter

## 2013-12-31 MED ORDER — PHENAZOPYRIDINE HCL 200 MG PO TABS: 200.0000 mg | ORAL_TABLET | Freq: Three times a day (TID) | ORAL | Status: DC | PRN

## 2013-12-31 MED ORDER — PROMETHAZINE HCL 25 MG/ML IJ SOLN
6.2500 mg | INTRAMUSCULAR | Status: DC | PRN
  Administered 2013-12-31: 6.25 mg via INTRAVENOUS
  Filled 2013-12-31: qty 1

## 2013-12-31 MED ORDER — SODIUM CHLORIDE 0.9 % IR SOLN
Status: DC | PRN
  Administered 2013-12-31: 6000 mL

## 2013-12-31 MED ORDER — ONDANSETRON HCL 4 MG/2ML IJ SOLN
INTRAMUSCULAR | Status: DC | PRN
  Administered 2013-12-31: 4 mg via INTRAVENOUS

## 2013-12-31 MED ORDER — LACTATED RINGERS IV SOLN
INTRAVENOUS | Status: DC
  Administered 2013-12-31 (×2): via INTRAVENOUS
  Filled 2013-12-31: qty 1000

## 2013-12-31 MED ORDER — FENTANYL CITRATE 0.05 MG/ML IJ SOLN
INTRAMUSCULAR | Status: DC | PRN
  Administered 2013-12-31: 100 ug via INTRAVENOUS
  Administered 2013-12-31: 25 ug via INTRAVENOUS

## 2013-12-31 MED ORDER — DEXAMETHASONE SODIUM PHOSPHATE 4 MG/ML IJ SOLN
INTRAMUSCULAR | Status: DC | PRN
  Administered 2013-12-31: 10 mg via INTRAVENOUS

## 2013-12-31 MED ORDER — MIDAZOLAM HCL 5 MG/5ML IJ SOLN
INTRAMUSCULAR | Status: DC | PRN
  Administered 2013-12-31: 2 mg via INTRAVENOUS

## 2013-12-31 MED ORDER — FENTANYL CITRATE 0.05 MG/ML IJ SOLN
25.0000 ug | INTRAMUSCULAR | Status: DC | PRN
  Filled 2013-12-31: qty 1

## 2013-12-31 MED ORDER — MIDAZOLAM HCL 2 MG/2ML IJ SOLN
INTRAMUSCULAR | Status: AC
  Filled 2013-12-31: qty 2

## 2013-12-31 MED ORDER — LIDOCAINE HCL (CARDIAC) 20 MG/ML IV SOLN
INTRAVENOUS | Status: DC | PRN
  Administered 2013-12-31: 80 mg via INTRAVENOUS

## 2013-12-31 MED ORDER — TROSPIUM CHLORIDE ER 60 MG PO CP24: 60.0000 mg | ORAL_CAPSULE | Freq: Every day | ORAL | Status: DC

## 2013-12-31 MED ORDER — BELLADONNA ALKALOIDS-OPIUM 16.2-60 MG RE SUPP
RECTAL | Status: DC | PRN
  Administered 2013-12-31: 1 via RECTAL

## 2013-12-31 MED ORDER — KETOROLAC TROMETHAMINE 30 MG/ML IJ SOLN
15.0000 mg | Freq: Once | INTRAMUSCULAR | Status: DC | PRN
  Filled 2013-12-31: qty 1

## 2013-12-31 MED ORDER — 0.9 % SODIUM CHLORIDE (POUR BTL) OPTIME
TOPICAL | Status: DC | PRN
  Administered 2013-12-31: 1000 mL

## 2013-12-31 MED ORDER — IOHEXOL 350 MG/ML SOLN
INTRAVENOUS | Status: DC | PRN
  Administered 2013-12-31: 30 mL via URETHRAL

## 2013-12-31 MED ORDER — FENTANYL CITRATE 0.05 MG/ML IJ SOLN
INTRAMUSCULAR | Status: AC
Start: 2013-12-31 — End: 2013-12-31
  Filled 2013-12-31: qty 4

## 2013-12-31 MED ORDER — CIPROFLOXACIN IN D5W 400 MG/200ML IV SOLN
400.0000 mg | INTRAVENOUS | Status: AC
  Administered 2013-12-31: 400 mg via INTRAVENOUS
  Filled 2013-12-31: qty 200

## 2013-12-31 MED ORDER — CIPROFLOXACIN HCL 500 MG PO TABS: 500.0000 mg | ORAL_TABLET | Freq: Once | ORAL | Status: DC

## 2013-12-31 MED ORDER — PROPOFOL 10 MG/ML IV BOLUS
INTRAVENOUS | Status: DC | PRN
  Administered 2013-12-31: 200 mg via INTRAVENOUS

## 2013-12-31 MED ORDER — PROMETHAZINE HCL 25 MG/ML IJ SOLN
INTRAMUSCULAR | Status: AC
  Filled 2013-12-31: qty 1

## 2013-12-31 MED ORDER — BELLADONNA ALKALOIDS-OPIUM 16.2-60 MG RE SUPP
RECTAL | Status: AC
  Filled 2013-12-31: qty 1

## 2013-12-31 MED ORDER — ACETAMINOPHEN-CODEINE #3 300-30 MG PO TABS: 1.0000 | ORAL_TABLET | ORAL | Status: DC | PRN

## 2013-12-31 SURGICAL SUPPLY — 35 items
BAG DRAIN URO-CYSTO SKYTR STRL (DRAIN) ×4 IMPLANT
BASKET LASER NITINOL 1.9FR (BASKET) IMPLANT
BASKET STNLS GEMINI 4WIRE 3FR (BASKET) IMPLANT
BASKET STONE 1.7 NGAGE (UROLOGICAL SUPPLIES) ×4 IMPLANT
BASKET ZERO TIP NITINOL 2.4FR (BASKET) IMPLANT
CANISTER SUCT LVC 12 LTR MEDI- (MISCELLANEOUS) ×4 IMPLANT
CATH URET 5FR 28IN OPEN ENDED (CATHETERS) ×4 IMPLANT
CATH URET DUAL LUMEN 6-10FR 50 (CATHETERS) IMPLANT
CLOTH BEACON ORANGE TIMEOUT ST (SAFETY) ×4 IMPLANT
CONTOUR SOFT PERCUFLEX ×4 IMPLANT
DRAPE CAMERA CLOSED 9X96 (DRAPES) ×4 IMPLANT
FIBER LASER FLEXIVA 200 (UROLOGICAL SUPPLIES) IMPLANT
FIBER LASER FLEXIVA 365 (UROLOGICAL SUPPLIES) IMPLANT
GLOVE BIO SURGEON STRL SZ7.5 (GLOVE) ×4 IMPLANT
GLOVE BIOGEL M STER SZ 6 (GLOVE) ×4 IMPLANT
GLOVE INDICATOR 6.5 STRL GRN (GLOVE) ×4 IMPLANT
GOWN STRL REIN XL XLG (GOWN DISPOSABLE) IMPLANT
GOWN STRL REUS W/TWL LRG LVL3 (GOWN DISPOSABLE) ×4 IMPLANT
GOWN STRL REUS W/TWL XL LVL3 (GOWN DISPOSABLE) ×4 IMPLANT
GUIDEWIRE 0.038 PTFE COATED (WIRE) IMPLANT
GUIDEWIRE ANG ZIPWIRE 038X150 (WIRE) IMPLANT
GUIDEWIRE STR DUAL SENSOR (WIRE) ×8 IMPLANT
IV NS 1000ML (IV SOLUTION) ×2
IV NS 1000ML BAXH (IV SOLUTION) ×2 IMPLANT
IV NS IRRIG 3000ML ARTHROMATIC (IV SOLUTION) ×8 IMPLANT
KIT BALLIN UROMAX 15FX10 (LABEL) IMPLANT
KIT BALLN UROMAX 15FX4 (MISCELLANEOUS) IMPLANT
KIT BALLN UROMAX 26 75X4 (MISCELLANEOUS)
NS IRRIG 500ML POUR BTL (IV SOLUTION) ×4 IMPLANT
PACK CYSTOSCOPY (CUSTOM PROCEDURE TRAY) ×4 IMPLANT
SET HIGH PRES BAL DIL (LABEL)
SHEATH ACCESS URETERAL 38CM (SHEATH) IMPLANT
SHEATH ACCESS URETERAL 54CM (SHEATH) IMPLANT
STENT URET 6FRX26 CONTOUR (STENTS) ×4 IMPLANT
TUBE FEEDING 8FR 16IN STR KANG (MISCELLANEOUS) IMPLANT

## 2013-12-31 NOTE — Anesthesia Postprocedure Evaluation (Signed)
  Anesthesia Post-op Note  Patient: Melissa Beck  Procedure(s) Performed: Procedure(s) (LRB): CYSTOSCOPY LEFT URETEROSCOPY,  LEFT RETROGRADE PYELOGRAM,  LEFT STONE REMOVAL WITH NGAGE BASKET, LEFT URETERAL STENT EXCHANGE  (Left)  Patient Location: PACU  Anesthesia Type: General  Level of Consciousness: awake and alert   Airway and Oxygen Therapy: Patient Spontanous Breathing  Post-op Pain: mild  Post-op Assessment: Post-op Vital signs reviewed, Patient's Cardiovascular Status Stable, Respiratory Function Stable, Patent Airway and No signs of Nausea or vomiting  Last Vitals:  Filed Vitals:   12/31/13 1215  BP: 129/83  Pulse: 88  Temp:   Resp: 11    Post-op Vital Signs: stable   Complications: No apparent anesthesia complications

## 2013-12-31 NOTE — Op Note (Signed)
Preoperative diagnosis: left ureteral calculus  Postoperative diagnosis: left ureteral calculus  Procedure:  1. Cystoscopy 2. left ureteroscopy and stone removal 3. left 67F x 26 ureteral stent exchange 4. left retrograde pyelography with interpretation  Surgeon: Crist FatBenjamin W. Marnae Madani, MD  Anesthesia: General  Complications: None  Intraoperative findings: left retrograde pyelography demonstrated a filling defect within the left ureter consistent with the patient's known calculus without other abnormalities.  EBL: Minimal  Specimens: 1. left ureteral calculus  Disposition of specimens: [facility] for stone analysis  Indication: Melissa Beck is a 38 y.o.   patient with urolithiasis.  She presented to the emergency department with fever and significant renal colic. A to the operating room and placed a stent. She was given a course of antibiotics. She presented to the clinic yesterday afebrile with no complications from stent placement. Given her work schedule we decided to move her stone removal up to today. After reviewing the management options for treatment, the patient elected to proceed with the above surgical procedure(s). We have discussed the potential benefits and risks of the procedure, side effects of the proposed treatment, the likelihood of the patient achieving the goals of the procedure, and any potential problems that might occur during the procedure or recuperation. Informed consent has been obtained.  Description of procedure:  The patient was taken to the operating room and general anesthesia was induced.  The patient was placed in the dorsal lithotomy position, prepped and draped in the usual sterile fashion, and preoperative antibiotics were administered. A preoperative time-out was performed.   Cystourethroscopy was performed.  The patient's urethra was examined and was normal. The bladder was then systematically examined in its entirety. There was no  evidence for any bladder tumors, stones, or other mucosal pathology.  There was some bullous edema changes from previously placed stent. The stent was then grasped with a stent grasper and pulled to the urethral meatus. I then passed a 0.38 sensor wire through the stent and passed up into the left renal pelvis. I then removed the stent over the wire. I then placed a 5 [place] open-ended ureteral catheter over the wire and removed the wire. I then performed a retrograde pyelogram pulling the catheter down to the urethral meatus. A retrograde pyelogram was then performed with the above findings.  A 0.38 sensor guidewire was then advanced through the 5 [place] open-ended ureteral catheter and up the ureter into the renal pelvis under fluoroscopic guidance. The catheter was then removed over the wire. The 6 Fr semirigid ureteroscope was then advanced into the ureter next to the guidewire and the calculus was identified.  I was then able to remove the stone from the ureter with an N-gage nitinol basket, without requiring fragmentation.  Reinspection of the ureter revealed no remaining visible stones or fragments. I then removed the rigid ureteroscope and passed a flexible ureteroscope up into the renal pelvis. I evacuated all blood clots and explored the renal pelvis with fluoroscopic guidance to ensure that there were no stones remaining in the renal pelvis. I then injected some contrast into renal pelvis and removed the scope.  I then backloaded the stent over the guidewire and advanced the stent up into the renal pelvis. I then removed the wire from the stent and the stent retracted into the bladder and was noted to be nicely curled in the bladder as well as renal pelvis fluoroscopically.   The bladder was then emptied and the procedure ended.  The patient appeared to  tolerate the procedure well and without complications.  The patient was able to be awakened and transferred to the recovery unit in satisfactory  condition.   Disposition: The tether of the stent was left on and tucked inside the patient's vagina.  Instructions for removing the stent have been provided to the patient. This has been scheduled for followup in 6 weeks with a renal ultrasound.

## 2013-12-31 NOTE — H&P (Signed)
Reason For Visit Follow-up status post left ureteral stent placement for 5 mm distal ureteral stone.   History of Present Illness This is a 38 year old female who I'm seeing in follow-up from urgent stent placement for a 5 mm left distal ureteral stone with fevers up to 102. The stent was placed without complication, and renal pelvis urine cultures were negative. She is tolerating the stent reasonably well. She denies any known fevers, chills, or dysuria. The patient has had frequency and some urinary incontinence. She denies any pain.   Surgical History Problems  1. History of Cystoscopy With Insertion Of Ureteral Stent Left  Current Meds 1. Cipro 500 MG Oral Tablet;  Therapy: (Recorded:11Jun2015) to Recorded 2. Phenazopyridine HCl - 200 MG Oral Tablet;  Therapy: (Recorded:11Jun2015) to Recorded 3. [place] XR 60 MG CP24;  Therapy: (Recorded:11Jun2015) to Recorded 4. Tylenol with Codeine #3 300-30 MG Oral Tablet;  Therapy: (Recorded:11Jun2015) to Recorded  Allergies Medication  1. No Known Drug Allergies  Family History Problems  1. No pertinent family history : Mother  Social History Problems  1. Denied: History of Alcohol use 2. Caffeine use (V49.89) 3. Former smoker (V15.82)   quit 2014 4. Married 5. 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 Three children  Review of Systems No changes in pts bowel habits, neurological changes, or progressive lower urinary tract symptoms.    Vitals Vital Signs [Data Includes: Last 1 Day]  Recorded: 11Jun2015 11:19AM  Blood Pressure: 120 / 78 Temperature: 97.9 F Heart Rate: 80 Recorded: 11Jun2015 11:16AM  Height: 5 ft 6 in Weight: 205 lb  BMI Calculated: 33.09 BSA Calculated: 2.02  Physical Exam Pulmonary: No respiratory distress and normal respiratory rhythm and effort.  Cardiovascular: Heart rate and rhythm are normal . No peripheral edema.  Abdomen: The abdomen is soft and nontender.  Skin: Normal skin turgor, no visible rash  and no visible skin lesions.  Neuro/Psych:. Mood and affect are appropriate.    Results/Data Urine [Data Includes: Last 1 Day]   11Jun2015  COLOR YELLOW   APPEARANCE CLOUDY   SPECIFIC GRAVITY 1.020   pH 6.0   GLUCOSE NEG mg/dL  BILIRUBIN NEG   KETONE NEG mg/dL  BLOOD LARGE   PROTEIN 100 mg/dL  UROBILINOGEN 0.2 mg/dL  NITRITE NEG   LEUKOCYTE ESTERASE TRACE   SQUAMOUS EPITHELIAL/HPF MODERATE   WBC 3-6 WBC/hpf  RBC TNTC RBC/hpf  BACTERIA FEW   CRYSTALS NONE SEEN   CASTS NONE SEEN   Other MUCUS    KUB was performed in our office today. The renal shadows are present bilaterally. There is a stent positioned in the left ureter with a curl in the renal pelvis as well as in the bladder. It appears to be well positioned. The ureteral stone on the left-hand side is not clearly visible, it may be in the distal ureter but is difficult to be certain. There are no additional calcifications within the expected trajectory of either ureter.   Assessment Left distal obstructing stone status post ureteral stent placement. The patient has not had any evidence of infection despite being febrile prior to her procedure. She has been on ciprofloxacin since her procedure. Today she appears to have developed a yeast infection.   Plan Calculus of ureter  1. Start: Fluconazole 150 MG Oral Tablet (Diflucan); TAKE 1 TABLET 1 TIME ONLY 2. Follow-up Schedule Surgery Office  Follow-up  Status: Complete  Done: 11Jun2015 Health Maintenance  3. UA With REFLEX; [Do Not Release]; Status:Complete;   Done: 11Jun2015 10:56AM  Discussion/Summary I detailed the management options for the patient including shockwave lithotripsy and ureteroscopy. Given that the stone is difficult to visualize on KUB I don't think shockwave lithotripsy is a viable option for her. I detailed the ureteroscopic procedure including the risk and benefits thereof. The patient would like to proceed with this option. We will try and get this  scheduled tomorrow if possible, otherwise we will work this into her schedule in the future.

## 2013-12-31 NOTE — Transfer of Care (Signed)
Immediate Anesthesia Transfer of Care Note  Patient: Melissa Beck  Procedure(s) Performed: Procedure(s): CYSTOSCOPY LEFT URETEROSCOPY,  LEFT RETROGRADE PYELOGRAM,  LEFT STONE REMOVAL WITH NGAGE BASKET, LEFT URETERAL STENT EXCHANGE  (Left)  Patient Location: PACU  Anesthesia Type:MAC  Level of Consciousness: sedated and patient cooperative  Airway & Oxygen Therapy: Patient Spontanous Breathing and Patient connected to face mask oxygen  Post-op Assessment: Report given to PACU RN and Post -op Vital signs reviewed and stable  Post vital signs: Reviewed and stable  Complications: No apparent anesthesia complications

## 2013-12-31 NOTE — Anesthesia Procedure Notes (Signed)
Procedure Name: LMA Insertion Date/Time: 12/31/2013 11:14 AM Performed by: Gar GibbonKEETON, Redford Behrle S Pre-anesthesia Checklist: Patient identified, Emergency Drugs available, Suction available and Patient being monitored Patient Re-evaluated:Patient Re-evaluated prior to inductionOxygen Delivery Method: Circle System Utilized Preoxygenation: Pre-oxygenation with 100% oxygen Intubation Type: IV induction Ventilation: Mask ventilation without difficulty LMA: LMA inserted LMA Size: 4.0 Number of attempts: 1 Airway Equipment and Method: bite block Placement Confirmation: positive ETCO2 Tube secured with: Tape Dental Injury: Teeth and Oropharynx as per pre-operative assessment

## 2013-12-31 NOTE — Discharge Instructions (Signed)
DISCHARGE INSTRUCTIONS FOR KIDNEY STONE/URETERAL STENT   MEDICATIONS:  1.  Resume all your other meds from home - except do not take any extra narcotic pain meds that you may have at home.  2. Trospium is to prevent bladder spasms and help reduce urinary frequency. 3. Pyridium is to help with the burning/stinging when you urinate. 4. Tylenol with codeine is for moderate/severe pain, otherwise taking upto 1000mg  every 6 hours of plainTylenol will help treat your pain.  Do not take both at the same time. 5. Take Cipro one hour prior to removal of your stent.   ACTIVITY:  1. No strenuous activity x 1week  2. No driving while on narcotic pain medications  3. Drink plenty of water  4. Continue to walk at home - you can still get blood clots when you are at home, so keep active, but don't over do it.  5. May return to work/school tomorrow or when you feel ready   BATHING:  1. You can shower and we recommend daily showers  2. You have a string coming from your urethra: The stent string is attached to your ureteral stent. Do not pull on this.   SIGNS/SYMPTOMS TO CALL:  Please call us if you have a fever greater than 101.5, uncontrolled nausea/vomiting, uncontrolled pain, dizziness, unable to urinate, bloody urine, chest pain, shortness of breath, leg swelling, leg pain, redness around wound, drainage from wound, or any other concerns or questions.   You can reach us at [phone].   FOLLOW-UP:  1. You have an appointment in 6 weeks with a ultrasound of your kidneys prior.  2. ou have a string attached to your stent, you may remove it on June 17th in the AM. To do this, pull the strings until the stents are completely removed. You may feel an odd sensation in your back.   Post Anesthesia Home Care Instructions  Activity: Get plenty of rest for the remainder of the day. A responsible adult should stay with you for 24 hours following the procedure.  For the next 24 hours, DO NOT: -Drive a  car -Advertising copywriterperate machinery -Drink alcoholic beverages -Take any medication unless instructed by your physician -Make any legal decisions or sign important papers.  Meals: Start with liquid foods such as gelatin or soup. Progress to regular foods as tolerated. Avoid greasy, spicy, heavy foods. If nausea and/or vomiting occur, drink only clear liquids until the nausea and/or vomiting subsides. Call your physician if vomiting continues.  Special Instructions/Symptoms: Your throat may feel dry or sore from the anesthesia or the breathing tube placed in your throat during surgery. If this causes discomfort, gargle with warm salt water. The discomfort should disappear within 24 hours.

## 2013-12-31 NOTE — Anesthesia Preprocedure Evaluation (Signed)
Anesthesia Evaluation  Patient identified by MRN, date of birth, ID band Patient awake    Reviewed: Allergy & Precautions, H&P , NPO status , Patient's Chart, lab work & pertinent test results  Airway Mallampati: II TM Distance: >3 FB Neck ROM: Full    Dental no notable dental hx.    Pulmonary neg pulmonary ROS, former smoker,  breath sounds clear to auscultation  Pulmonary exam normal       Cardiovascular negative cardio ROS  Rhythm:Regular Rate:Normal     Neuro/Psych negative neurological ROS  negative psych ROS   GI/Hepatic negative GI ROS, Neg liver ROS,   Endo/Other  obesity  Renal/GU negative Renal ROS  negative genitourinary   Musculoskeletal negative musculoskeletal ROS (+)   Abdominal   Peds negative pediatric ROS (+)  Hematology negative hematology ROS (+)   Anesthesia Other Findings   Reproductive/Obstetrics negative OB ROS                           Anesthesia Physical Anesthesia Plan  ASA: II  Anesthesia Plan: General   Post-op Pain Management:    Induction: Intravenous  Airway Management Planned: LMA  Additional Equipment:   Intra-op Plan:   Post-operative Plan: Extubation in OR  Informed Consent: I have reviewed the patients History and Physical, chart, labs and discussed the procedure including the risks, benefits and alternatives for the proposed anesthesia with the patient or authorized representative who has indicated his/her understanding and acceptance.   Dental advisory given  Plan Discussed with: CRNA and Surgeon  Anesthesia Plan Comments:         Anesthesia Quick Evaluation

## 2014-01-03 ENCOUNTER — Encounter (HOSPITAL_BASED_OUTPATIENT_CLINIC_OR_DEPARTMENT_OTHER): Payer: Self-pay | Admitting: Urology

## 2014-05-23 ENCOUNTER — Encounter (HOSPITAL_BASED_OUTPATIENT_CLINIC_OR_DEPARTMENT_OTHER): Payer: Self-pay | Admitting: Urology

## 2016-06-07 DIAGNOSIS — J019 Acute sinusitis, unspecified: Secondary | ICD-10-CM | POA: Diagnosis not present

## 2016-08-13 DIAGNOSIS — N898 Other specified noninflammatory disorders of vagina: Secondary | ICD-10-CM | POA: Diagnosis not present

## 2016-08-27 ENCOUNTER — Emergency Department (HOSPITAL_COMMUNITY): Payer: BLUE CROSS/BLUE SHIELD

## 2016-08-27 ENCOUNTER — Emergency Department (HOSPITAL_COMMUNITY): Payer: BLUE CROSS/BLUE SHIELD | Admitting: Certified Registered"

## 2016-08-27 ENCOUNTER — Encounter (HOSPITAL_COMMUNITY): Payer: Self-pay | Admitting: Emergency Medicine

## 2016-08-27 ENCOUNTER — Observation Stay (HOSPITAL_COMMUNITY)
Admission: EM | Admit: 2016-08-27 | Discharge: 2016-08-28 | Disposition: A | Discharge status: Home / Self Care | Payer: BLUE CROSS/BLUE SHIELD | Attending: General Surgery | Admitting: General Surgery

## 2016-08-27 ENCOUNTER — Encounter (HOSPITAL_COMMUNITY)
Admission: EM | Disposition: A | Discharge status: Home / Self Care | Payer: Self-pay | Source: Home / Self Care | Attending: Physician Assistant

## 2016-08-27 DIAGNOSIS — R1031 Right lower quadrant pain: Secondary | ICD-10-CM | POA: Diagnosis not present

## 2016-08-27 DIAGNOSIS — Z87891 Personal history of nicotine dependence: Secondary | ICD-10-CM | POA: Insufficient documentation

## 2016-08-27 DIAGNOSIS — K3580 Unspecified acute appendicitis: Secondary | ICD-10-CM

## 2016-08-27 DIAGNOSIS — D729 Disorder of white blood cells, unspecified: Secondary | ICD-10-CM

## 2016-08-27 DIAGNOSIS — R319 Hematuria, unspecified: Secondary | ICD-10-CM

## 2016-08-27 DIAGNOSIS — R1011 Right upper quadrant pain: Secondary | ICD-10-CM | POA: Diagnosis not present

## 2016-08-27 DIAGNOSIS — K8020 Calculus of gallbladder without cholecystitis without obstruction: Secondary | ICD-10-CM

## 2016-08-27 DIAGNOSIS — K353 Acute appendicitis with localized peritonitis: Principal | ICD-10-CM | POA: Insufficient documentation

## 2016-08-27 DIAGNOSIS — N390 Urinary tract infection, site not specified: Secondary | ICD-10-CM | POA: Diagnosis not present

## 2016-08-27 DIAGNOSIS — K219 Gastro-esophageal reflux disease without esophagitis: Secondary | ICD-10-CM | POA: Insufficient documentation

## 2016-08-27 DIAGNOSIS — K37 Unspecified appendicitis: Secondary | ICD-10-CM | POA: Diagnosis present

## 2016-08-27 DIAGNOSIS — R509 Fever, unspecified: Secondary | ICD-10-CM

## 2016-08-27 DIAGNOSIS — R109 Unspecified abdominal pain: Secondary | ICD-10-CM | POA: Diagnosis not present

## 2016-08-27 DIAGNOSIS — R112 Nausea with vomiting, unspecified: Secondary | ICD-10-CM

## 2016-08-27 DIAGNOSIS — D649 Anemia, unspecified: Secondary | ICD-10-CM | POA: Insufficient documentation

## 2016-08-27 DIAGNOSIS — D72829 Elevated white blood cell count, unspecified: Secondary | ICD-10-CM | POA: Diagnosis not present

## 2016-08-27 HISTORY — PX: LAPAROSCOPIC APPENDECTOMY: SHX408

## 2016-08-27 LAB — COMPREHENSIVE METABOLIC PANEL
ALBUMIN: 3.9 g/dL (ref 3.5–5.0)
ALT: 12 U/L — ABNORMAL LOW (ref 14–54)
AST: 17 U/L (ref 15–41)
Alkaline Phosphatase: 66 U/L (ref 38–126)
Anion gap: 7 (ref 5–15)
BILIRUBIN TOTAL: 1.1 mg/dL (ref 0.3–1.2)
BUN: 11 mg/dL (ref 6–20)
CO2: 24 mmol/L (ref 22–32)
Calcium: 8.5 mg/dL — ABNORMAL LOW (ref 8.9–10.3)
Chloride: 104 mmol/L (ref 101–111)
Creatinine, Ser: 0.91 mg/dL (ref 0.44–1.00)
GFR calc Af Amer: >60 mL/min (ref >60)
GFR calc non Af Amer: >60 mL/min (ref >60)
GLUCOSE: 109 mg/dL — AB (ref 65–99)
POTASSIUM: 3.4 mmol/L — AB (ref 3.5–5.1)
Sodium: 135 mmol/L (ref 135–145)
TOTAL PROTEIN: 7.5 g/dL (ref 6.5–8.1)

## 2016-08-27 LAB — URINALYSIS, ROUTINE W REFLEX MICROSCOPIC
Bilirubin Urine: NEGATIVE
Glucose, UA: NEGATIVE mg/dL
Ketones, ur: 20 mg/dL — AB
Leukocytes, UA: NEGATIVE
Nitrite: NEGATIVE
PROTEIN: 30 mg/dL — AB
SPECIFIC GRAVITY, URINE: 1.029 (ref 1.005–1.030)
pH: 5 (ref 5.0–8.0)

## 2016-08-27 LAB — DIFFERENTIAL
BASOS ABS: 0 10*3/uL (ref 0.0–0.1)
BASOS PCT: 0 %
EOS ABS: 0 10*3/uL (ref 0.0–0.7)
Eosinophils Relative: 0 %
Lymphocytes Relative: 9 %
Lymphs Abs: 1.3 10*3/uL (ref 0.7–4.0)
MONOS PCT: 7 %
Monocytes Absolute: 0.9 10*3/uL (ref 0.1–1.0)
NEUTROS ABS: 11.6 10*3/uL — AB (ref 1.7–7.7)
Neutrophils Relative %: 84 %

## 2016-08-27 LAB — CBC
HEMATOCRIT: 34.1 % — AB (ref 36.0–46.0)
HEMOGLOBIN: 11.3 g/dL — AB (ref 12.0–15.0)
MCH: 28 pg (ref 26.0–34.0)
MCHC: 33.1 g/dL (ref 30.0–36.0)
MCV: 84.4 fL (ref 78.0–100.0)
Platelets: 219 10*3/uL (ref 150–400)
RBC: 4.04 MIL/uL (ref 3.87–5.11)
RDW: 14 % (ref 11.5–15.5)
WBC: 13 10*3/uL — ABNORMAL HIGH (ref 4.0–10.5)

## 2016-08-27 LAB — LIPASE, BLOOD: Lipase: 16 U/L (ref 11–51)

## 2016-08-27 LAB — I-STAT BETA HCG BLOOD, ED (MC, WL, AP ONLY)

## 2016-08-27 SURGERY — APPENDECTOMY, LAPAROSCOPIC
Anesthesia: General | Site: Abdomen

## 2016-08-27 MED ORDER — MORPHINE SULFATE (PF) 4 MG/ML IV SOLN
4.0000 mg | Freq: Once | INTRAVENOUS | Status: AC
Start: 2016-08-27 — End: 2016-08-27
  Administered 2016-08-27: 4 mg via INTRAVENOUS
  Filled 2016-08-27: qty 1

## 2016-08-27 MED ORDER — METRONIDAZOLE IN NACL 5-0.79 MG/ML-% IV SOLN
500.0000 mg | Freq: Once | INTRAVENOUS | Status: AC
  Administered 2016-08-27: 500 mg via INTRAVENOUS
  Filled 2016-08-27: qty 100

## 2016-08-27 MED ORDER — SODIUM CHLORIDE 0.9 % IV SOLN
INTRAVENOUS | Status: DC | PRN
  Administered 2016-08-27 (×2): via INTRAVENOUS

## 2016-08-27 MED ORDER — LIDOCAINE 2% (20 MG/ML) 5 ML SYRINGE
INTRAMUSCULAR | Status: AC
  Filled 2016-08-27: qty 5

## 2016-08-27 MED ORDER — MORPHINE SULFATE (PF) 4 MG/ML IV SOLN
8.0000 mg | Freq: Once | INTRAVENOUS | Status: AC
  Administered 2016-08-27: 8 mg via INTRAVENOUS
  Filled 2016-08-27: qty 2

## 2016-08-27 MED ORDER — ROCURONIUM BROMIDE 10 MG/ML (PF) SYRINGE
PREFILLED_SYRINGE | INTRAVENOUS | Status: DC | PRN
  Administered 2016-08-27: 10 mg via INTRAVENOUS
  Administered 2016-08-27: 30 mg via INTRAVENOUS

## 2016-08-27 MED ORDER — MIDAZOLAM HCL 5 MG/5ML IJ SOLN
INTRAMUSCULAR | Status: DC | PRN
  Administered 2016-08-27: 2 mg via INTRAVENOUS

## 2016-08-27 MED ORDER — SODIUM CHLORIDE 0.9 % IV BOLUS (SEPSIS)
1000.0000 mL | Freq: Once | INTRAVENOUS | Status: AC
  Administered 2016-08-27: 1000 mL via INTRAVENOUS

## 2016-08-27 MED ORDER — DEXTROSE 5 % IV SOLN
1.0000 g | Freq: Once | INTRAVENOUS | Status: AC
  Administered 2016-08-27: 1 g via INTRAVENOUS
  Filled 2016-08-27: qty 10

## 2016-08-27 MED ORDER — LIDOCAINE 2% (20 MG/ML) 5 ML SYRINGE
INTRAMUSCULAR | Status: DC | PRN
  Administered 2016-08-27: 100 mg via INTRAVENOUS

## 2016-08-27 MED ORDER — IOPAMIDOL (ISOVUE-300) INJECTION 61%
INTRAVENOUS | Status: AC
  Filled 2016-08-27: qty 30

## 2016-08-27 MED ORDER — MIDAZOLAM HCL 2 MG/2ML IJ SOLN
INTRAMUSCULAR | Status: AC
  Filled 2016-08-27: qty 2

## 2016-08-27 MED ORDER — PROPOFOL 10 MG/ML IV BOLUS
INTRAVENOUS | Status: DC | PRN
  Administered 2016-08-27: 180 mg via INTRAVENOUS

## 2016-08-27 MED ORDER — ONDANSETRON HCL 4 MG/2ML IJ SOLN
INTRAMUSCULAR | Status: AC
  Filled 2016-08-27: qty 2

## 2016-08-27 MED ORDER — DEXAMETHASONE SODIUM PHOSPHATE 10 MG/ML IJ SOLN
INTRAMUSCULAR | Status: AC
  Filled 2016-08-27: qty 1

## 2016-08-27 MED ORDER — SUGAMMADEX SODIUM 500 MG/5ML IV SOLN
INTRAVENOUS | Status: DC | PRN
  Administered 2016-08-27: 200 mg via INTRAVENOUS

## 2016-08-27 MED ORDER — DEXAMETHASONE SODIUM PHOSPHATE 10 MG/ML IJ SOLN
INTRAMUSCULAR | Status: DC | PRN
  Administered 2016-08-27: 10 mg via INTRAVENOUS

## 2016-08-27 MED ORDER — ONDANSETRON HCL 4 MG/2ML IJ SOLN
4.0000 mg | Freq: Once | INTRAMUSCULAR | Status: AC
  Administered 2016-08-27: 4 mg via INTRAVENOUS
  Filled 2016-08-27: qty 2

## 2016-08-27 MED ORDER — IOPAMIDOL (ISOVUE-300) INJECTION 61%
30.0000 mL | Freq: Once | INTRAVENOUS | Status: AC | PRN
  Administered 2016-08-27: 30 mL via ORAL

## 2016-08-27 MED ORDER — PROPOFOL 10 MG/ML IV BOLUS
INTRAVENOUS | Status: AC
  Filled 2016-08-27: qty 20

## 2016-08-27 MED ORDER — SODIUM CHLORIDE 0.9 % IR SOLN
Status: DC | PRN
  Administered 2016-08-27: 1000 mL

## 2016-08-27 MED ORDER — LACTATED RINGERS IR SOLN
Status: DC | PRN
  Administered 2016-08-27: 2000 mL

## 2016-08-27 MED ORDER — SUGAMMADEX SODIUM 200 MG/2ML IV SOLN
INTRAVENOUS | Status: AC
  Filled 2016-08-27: qty 2

## 2016-08-27 MED ORDER — SUCCINYLCHOLINE CHLORIDE 200 MG/10ML IV SOSY
PREFILLED_SYRINGE | INTRAVENOUS | Status: AC
  Filled 2016-08-27: qty 10

## 2016-08-27 MED ORDER — ROCURONIUM BROMIDE 50 MG/5ML IV SOSY
PREFILLED_SYRINGE | INTRAVENOUS | Status: AC
  Filled 2016-08-27: qty 5

## 2016-08-27 MED ORDER — BUPIVACAINE HCL (PF) 0.25 % IJ SOLN
INTRAMUSCULAR | Status: DC | PRN
  Administered 2016-08-27: 22 mL

## 2016-08-27 MED ORDER — HYDROMORPHONE HCL 1 MG/ML IJ SOLN: 0.2500 mg | INTRAMUSCULAR | Status: DC | PRN

## 2016-08-27 MED ORDER — BUPIVACAINE HCL (PF) 0.25 % IJ SOLN
INTRAMUSCULAR | Status: AC
  Filled 2016-08-27: qty 30

## 2016-08-27 MED ORDER — ONDANSETRON HCL 4 MG/2ML IJ SOLN
INTRAMUSCULAR | Status: DC | PRN
Start: 2016-08-27 — End: 2016-08-27
  Administered 2016-08-27: 4 mg via INTRAVENOUS

## 2016-08-27 MED ORDER — FENTANYL CITRATE (PF) 250 MCG/5ML IJ SOLN
INTRAMUSCULAR | Status: AC
Start: 2016-08-27 — End: 2016-08-27
  Filled 2016-08-27: qty 5

## 2016-08-27 MED ORDER — SUCCINYLCHOLINE CHLORIDE 200 MG/10ML IV SOSY
PREFILLED_SYRINGE | INTRAVENOUS | Status: DC | PRN
  Administered 2016-08-27: 160 mg via INTRAVENOUS

## 2016-08-27 MED ORDER — FENTANYL CITRATE (PF) 100 MCG/2ML IJ SOLN
INTRAMUSCULAR | Status: DC | PRN
  Administered 2016-08-27 (×3): 50 ug via INTRAVENOUS
  Administered 2016-08-27: 100 ug via INTRAVENOUS

## 2016-08-27 SURGICAL SUPPLY — 41 items
APPLIER CLIP 5 13 M/L LIGAMAX5 (MISCELLANEOUS)
APPLIER CLIP ROT 10 11.4 M/L (STAPLE)
CABLE HIGH FREQUENCY MONO STRZ (ELECTRODE) ×3 IMPLANT
CLIP APPLIE 5 13 M/L LIGAMAX5 (MISCELLANEOUS) IMPLANT
CLIP APPLIE ROT 10 11.4 M/L (STAPLE) IMPLANT
CLOSURE WOUND 1/2 X4 (GAUZE/BANDAGES/DRESSINGS)
COVER SURGICAL LIGHT HANDLE (MISCELLANEOUS) ×3 IMPLANT
CUTTER FLEX LINEAR 45M (STAPLE) ×3 IMPLANT
DERMABOND ADVANCED (GAUZE/BANDAGES/DRESSINGS)
DERMABOND ADVANCED .7 DNX12 (GAUZE/BANDAGES/DRESSINGS) IMPLANT
DEVICE PMI PUNCTURE CLOSURE (MISCELLANEOUS) ×3 IMPLANT
DRAPE LAPAROSCOPIC ABDOMINAL (DRAPES) ×3 IMPLANT
ELECT PENCIL ROCKER SW 15FT (MISCELLANEOUS) IMPLANT
ELECT REM PT RETURN 9FT ADLT (ELECTROSURGICAL) ×3
ELECTRODE REM PT RTRN 9FT ADLT (ELECTROSURGICAL) ×1 IMPLANT
GLOVE BIO SURGEON STRL SZ7.5 (GLOVE) ×3 IMPLANT
GLOVE INDICATOR 8.0 STRL GRN (GLOVE) ×3 IMPLANT
GOWN STRL REUS W/TWL LRG LVL3 (GOWN DISPOSABLE) ×3 IMPLANT
GOWN STRL REUS W/TWL XL LVL3 (GOWN DISPOSABLE) ×6 IMPLANT
IRRIG SUCT STRYKERFLOW 2 WTIP (MISCELLANEOUS) ×3
IRRIGATION SUCT STRKRFLW 2 WTP (MISCELLANEOUS) ×1 IMPLANT
IV LACTATED RINGERS 1000ML (IV SOLUTION) ×6 IMPLANT
KIT BASIN OR (CUSTOM PROCEDURE TRAY) ×3 IMPLANT
L-HOOK LAP DISP 36CM (ELECTROSURGICAL)
LHOOK LAP DISP 36CM (ELECTROSURGICAL) IMPLANT
POUCH RETRIEVAL ECOSAC 10 (ENDOMECHANICALS) ×1 IMPLANT
POUCH RETRIEVAL ECOSAC 10MM (ENDOMECHANICALS) ×2
POUCH SPECIMEN RETRIEVAL 10MM (ENDOMECHANICALS) ×3 IMPLANT
RELOAD 45 VASCULAR/THIN (ENDOMECHANICALS) ×3 IMPLANT
RELOAD STAPLE TA45 3.5 REG BLU (ENDOMECHANICALS) IMPLANT
SHEARS HARMONIC ACE PLUS 36CM (ENDOMECHANICALS) ×3 IMPLANT
STRIP CLOSURE SKIN 1/2X4 (GAUZE/BANDAGES/DRESSINGS) IMPLANT
SUT MNCRL AB 4-0 PS2 18 (SUTURE) ×3 IMPLANT
SUT VICRYL 0 UR6 27IN ABS (SUTURE) ×3 IMPLANT
TOWEL OR 17X26 10 PK STRL BLUE (TOWEL DISPOSABLE) ×3 IMPLANT
TRAY FOLEY CATH 14FR (SET/KITS/TRAYS/PACK) ×3 IMPLANT
TRAY FOLEY W/METER SILVER 16FR (SET/KITS/TRAYS/PACK) IMPLANT
TRAY LAPAROSCOPIC (CUSTOM PROCEDURE TRAY) ×3 IMPLANT
TROCAR XCEL BLUNT TIP 100MML (ENDOMECHANICALS) ×3 IMPLANT
TROCAR XCEL NON-BLD 5MMX100MML (ENDOMECHANICALS) ×6 IMPLANT
TUBING INSUF HEATED (TUBING) ×3 IMPLANT

## 2016-08-27 NOTE — ED Provider Notes (Signed)
[facility]-EMERGENCY DEPT Provider Note   CSN: [id number] Arrival date & time: 08/27/16  1319     History   Chief Complaint Chief Complaint  Patient presents with  . Abdominal Pain    HPI Melissa Beck is a 41 y.o. female with a PMHx of nephrolithiasis s/p cystoscopy with stent placement by Dr. Marlou PorchHerrick in 12/2013, and PSHx of tubal ligation, who presents to the ED with complaints of gradual onset worsening right flank pain that began yesterday around 2:30 AM. Patient states that initially she thought she was getting the flu because she had body aches and chills, but then it began to be more localized in the right flank and started to feel similar to prior kidney stones. She describes the pain is 9/10 constant sharp right flank pain radiating to the right upper and lower quadrants, worse with movement, and mildly improved with ibuprofen and Zanaflex that she took yesterday. She has not taken anything today. Additional symptoms include malodorous urine, nausea, and 5 episodes of nonbloody nonbilious emesis She went to [facility] walk-in clinic and was found to have leukocytosis and a fever of 101.4 so she was sent here for further evaluation. She also mentions she's had some mild tingling of R hip but thinks it's because she's been favoring that side.   She denies cough, rhinorrhea, sore throat, ear pain/drainage, URI symptoms, CP, SOB, diarrhea, constipation, melena, hematochezia, hematemesis, obstipation, hematuria, dysuria, urinary frequency, vaginal discharge, arthralgias, numbness, focal weakness, or any other complaints at this time. She denies recent travel, sick contacts, suspicious food intake, EtOH use, or chronic NSAID use. Currently on menses, which started 08/25/16. Ate a bite of candy bar at around 3pm but otherwise hasn't eaten anything else today.   The history is provided by the patient and medical records. No language interpreter was used.  Flank Pain  This is a new problem. The current  episode started yesterday. The problem occurs constantly. The problem has been gradually worsening. Associated symptoms include abdominal pain. Pertinent negatives include no chest pain and no shortness of breath. Exacerbated by: movement. The symptoms are relieved by NSAIDs and medications. Treatments tried: zanaflex and ibuprofen. The treatment provided mild relief.    Past Medical History:  Diagnosis Date  . Frequency of urination   . History of abnormal cervical Pap smear    s/p  cyro  . History of placenta abruption   . Left ureteral calculus   . Mild acid reflux   . Urgency of urination   . Wears glasses   . Yeast infection of the vagina     Patient Active Problem List   Diagnosis Date Noted  . Postpartum care following vaginal delivery (10/6) 04/27/2013  . VBAC, delivered (10/6) 04/26/2013  . Previous cesarean section - tolac 04/25/2013    Past Surgical History:  Procedure Laterality Date  . CESAREAN SECTION  2008  . CYSTOSCOPY WITH STENT PLACEMENT Left 12/25/2013   Procedure: CYSTOSCOPY WITH STENT PLACEMENT;  Surgeon: Crist FatBenjamin W Herrick, MD;  Location: [facility];  Service: Urology;  Laterality: Left;  . CYSTOSCOPY WITH URETEROSCOPY, STONE BASKETRY AND STENT PLACEMENT Left 12/31/2013   Procedure: CYSTOSCOPY LEFT URETEROSCOPY,  LEFT RETROGRADE PYELOGRAM,  LEFT STONE REMOVAL WITH NGAGE BASKET, LEFT URETERAL STENT EXCHANGE ;  Surgeon: Crist FatBenjamin W Herrick, MD;  [facility] [place];  Service: Urology;  Laterality: Left;  . LAPAROSCOPIC TUBAL LIGATION Bilateral 05/28/2013   Procedure: LAPAROSCOPIC TUBAL LIGATION WITH CAUTERIZATION;  Surgeon: Genia DelMarie-Lyne Lavoie, MD;  Location: [facility];  Service: Gynecology;  Laterality: Bilateral;  . WISDOM TOOTH EXTRACTION  2001    OB History    Gravida Para Term Preterm AB Living   3 3 2 1   3    SAB TAB Ectopic Multiple Live Births           3       Home Medications    Prior to Admission medications   Medication Sig Start Date  End Date Taking? Authorizing Provider  acetaminophen-codeine (TYLENOL #3) 300-30 MG per tablet Take 1 tablet by mouth every 4 (four) hours as needed. 12/31/13   Crist Fat, MD  Ca Carbonate-Mag Hydroxide (ROLAIDS PO) Take by mouth as needed.    Historical Provider, MD  ciprofloxacin (CIPRO) 500 MG tablet Take 1 tablet (500 mg total) by mouth once. 12/31/13   Crist Fat, MD  ibuprofen (ADVIL,MOTRIN) 200 MG tablet Take 400 mg by mouth every 6 (six) hours as needed for moderate pain.    Historical Provider, MD  phenazopyridine (PYRIDIUM) 200 MG tablet Take 1 tablet (200 mg total) by mouth 3 (three) times daily as needed for pain. 12/31/13   Crist Fat, MD  Trospium Chloride 60 MG CP24 Take 1 capsule (60 mg total) by mouth daily. 12/31/13   Crist Fat, MD    Family History Family History  Problem Relation Age of Onset  . COPD Mother   . Thrombocytopenia Daughter     Social History Social History  Substance Use Topics  . Smoking status: Former Smoker    Packs/day: 0.50    Years: 20.00    Types: Cigarettes    Quit date: 11/23/2012  . Smokeless tobacco: Never Used  . Alcohol use No     Allergies   Patient has no known allergies.   Review of Systems Review of Systems  Constitutional: Positive for chills and fever.  HENT: Negative for ear discharge, ear pain, rhinorrhea and sore throat.   Respiratory: Negative for cough and shortness of breath.   Cardiovascular: Negative for chest pain.  Gastrointestinal: Positive for abdominal pain, nausea and vomiting. Negative for blood in stool, constipation and diarrhea.  Genitourinary: Positive for flank pain. Negative for dysuria, frequency, hematuria and vaginal discharge.       +maldorous urine  Musculoskeletal: Positive for myalgias (body aches). Negative for arthralgias.  Skin: Negative for color change.  Allergic/Immunologic: Negative for immunocompromised state.  Neurological: Negative for weakness and  numbness.  Psychiatric/Behavioral: Negative for confusion.   10 Systems reviewed and are negative for acute change except as noted in the HPI.   Physical Exam Updated Vital Signs BP 126/69   Pulse 102   Temp 98.9 F (37.2 C)   Resp 18   SpO2 96%    Physical Exam  Constitutional: She is oriented to person, place, and time. Vital signs are normal. She appears well-developed and well-nourished.  Non-toxic appearance. No distress.  Afebrile, nontoxic, NAD  HENT:  Head: Normocephalic and atraumatic.  Mouth/Throat: Oropharynx is clear and moist and mucous membranes are normal.  Eyes: Conjunctivae and EOM are normal. Right eye exhibits no discharge. Left eye exhibits no discharge.  Neck: Normal range of motion. Neck supple.  Cardiovascular: Normal rate, regular rhythm, normal heart sounds and intact distal pulses.  Exam reveals no gallop and no friction rub.   No murmur heard. Pulmonary/Chest: Effort normal and breath sounds normal. No respiratory distress. She has no decreased breath sounds. She has no wheezes. She has no rhonchi. She has  no rales.  Abdominal: Soft. Normal appearance and bowel sounds are normal. She exhibits no distension. There is tenderness in the right upper quadrant and right lower quadrant. There is CVA tenderness, tenderness at McBurney's point and positive Murphy's sign. There is no rigidity, no rebound and no guarding.    Soft, nondistended, +BS throughout, with moderate RUQ and RLQ TTP tracking towards the R flank area, no r/g/r, +murphy's, +mcburney's area tenderness, +R sided CVA TTP   Musculoskeletal: Normal range of motion.  Neurological: She is alert and oriented to person, place, and time. She has normal strength. No sensory deficit.  Skin: Skin is warm, dry and intact. No rash noted.  Psychiatric: She has a normal mood and affect.  Nursing note and vitals reviewed.    ED Treatments / Results  Labs (all labs ordered are listed, but only abnormal  results are displayed) Labs Reviewed  COMPREHENSIVE METABOLIC PANEL - Abnormal; Notable for the following:       Result Value   Potassium 3.4 (*)    Glucose, Bld 109 (*)    Calcium 8.5 (*)    ALT 12 (*)    All other components within normal limits  CBC - Abnormal; Notable for the following:    WBC 13.0 (*)    Hemoglobin 11.3 (*)    HCT 34.1 (*)    All other components within normal limits  URINALYSIS, ROUTINE W REFLEX MICROSCOPIC - Abnormal; Notable for the following:    Color, Urine AMBER (*)    APPearance HAZY (*)    Hgb urine dipstick SMALL (*)    Ketones, ur 20 (*)    Protein, ur 30 (*)    Bacteria, UA MANY (*)    Squamous Epithelial / LPF 0-5 (*)    All other components within normal limits  DIFFERENTIAL - Abnormal; Notable for the following:    Neutro Abs 11.6 (*)    All other components within normal limits  URINE CULTURE  LIPASE, BLOOD  I-STAT BETA HCG BLOOD, ED ([facility], [facility], [facility] ONLY)    EKG  EKG Interpretation None       Radiology Ct Abdomen Pelvis Wo Contrast  Result Date: 08/27/2016 CLINICAL DATA:  Right flank pain for 2 days EXAM: CT ABDOMEN AND PELVIS WITHOUT CONTRAST TECHNIQUE: Multidetector CT imaging of the abdomen and pelvis was performed following the standard protocol without IV contrast. COMPARISON:  12/30/2013, 12/25/2013 FINDINGS: Lower chest: Lung bases are clear.  Heart size is normal. Hepatobiliary: No focal hepatic abnormality. Mild increased density within the dependent portion of gallbladder, may reflect sludge or small stones. No wall thickening. No biliary dilatation. Pancreas: Unremarkable. No pancreatic ductal dilatation or surrounding inflammatory changes. Spleen: Normal in size without focal abnormality. Adrenals/Urinary Tract: Adrenal glands are unremarkable. Kidneys are normal, without renal calculi, focal lesion, or hydronephrosis. Bladder is unremarkable. Stomach/Bowel: The stomach is nonenlarged. There is no dilated small bowel. Moderate right  lower quadrant inflammation is present. There is thickening of the terminal ileum. The appendix is enlarged, and measures up to 14 mm. Vascular/Lymphatic: Scattered right lower quadrant mesenteric nodes. Non aneurysmal aorta. Reproductive: Uterus and bilateral adnexa are unremarkable. Other: Small free fluid in the pelvis.  No free air. Musculoskeletal: No acute or significant osseous findings. IMPRESSION: 1. Enlarged appendix up to 14 mm. Although there is intraluminal gas present, given the moderate surrounding inflammatory changes, acute appendicitis is suspected. No extraluminal gas or free air. No abscess. 2. Negative for nephrolithiasis or hydronephrosis 3. Possible layering sludge  or stones within the gallbladder. Electronically Signed   By: Jasmine Pang M.D.   On: 08/27/2016 20:06    Procedures Procedures (including critical care time)  Medications Ordered in ED Medications  iopamidol (ISOVUE-300) 61 % injection (not administered)  cefTRIAXone (ROCEPHIN) 1 g in dextrose 5 % 50 mL IVPB (not administered)    And  metroNIDAZOLE (FLAGYL) IVPB 500 mg (not administered)  morphine 4 MG/ML injection 8 mg (not administered)  sodium chloride 0.9 % bolus 1,000 mL (0 mLs Intravenous Stopped 08/27/16 1938)  ondansetron (ZOFRAN) injection 4 mg (4 mg Intravenous Given 08/27/16 1817)  morphine 4 MG/ML injection 4 mg (4 mg Intravenous Given 08/27/16 1819)  cefTRIAXone (ROCEPHIN) 1 g in dextrose 5 % 50 mL IVPB (0 g Intravenous Stopped 08/27/16 1938)  iopamidol (ISOVUE-300) 61 % injection 30 mL (30 mLs Oral Contrast Given 08/27/16 1950)     Initial Impression / Assessment and Plan / ED Course  I have reviewed the triage vital signs and the nursing notes.  Pertinent labs & imaging results that were available during my care of the patient were reviewed by me and considered in my medical decision making (see chart for details).     41 y.o. female here with R flank pain x1 day, and n/v, malodorous urine, chills,  body aches, fever 101.4. States it feels like prior kidney stones. On exam, moderate R flank TTP and RUQ/RLQ TTP tracking towards the R flank, +murphys and +mcburneys but nonperitoneal exam. Labs reveal lipase WNL, CMP with mildly low K 3.4 but doubt need for repletion and otherwise unremarkable, CBC with leukocytosis 13.0 will add-on differential. U/A with many bacteria, nitrite/leuk neg, 6-30 RBCs, 0-5 WBCs and squamous, concerning for UTI. Will get BetaHCG, add-on differential and UCx, and get CT to eval for nephrolithiasis vs pyelonephritis vs cholecystitis (although doubtful) vs appendicitis vs other etiologies. Will give fluids, pain/nausea meds, and rocephin. Will reassess shortly  8:26 PM Differential with mild neutrophilic predominance. BetaHCG neg. CT showing findings of acute appendicitis, and possible sludge/stones in gallbladder. Will proceed with surgical consult for appendectomy (and perhaps also cholecystectomy while they're already doing surgery). Will add another rocephin 1g dose in addition to flagyl to cover for appendicitis/cholecystitis. Will order more pain meds. Pt and family updated and agree with plan. Discussed case with my attending Dr. Corlis Leak who agrees with plan.   8:38 PM Dr. Andrey Campanile of [facility] surgery returning page and will assess and admit patient. Please see their notes for further documentation of care. I appreciate their help with this pleasant pt's care. Pt stable at time of admission.   Final Clinical Impressions(s) / ED Diagnoses   Final diagnoses:  Right flank pain  Abdominal pain, unspecified abdominal location  Non-intractable vomiting with nausea, unspecified vomiting type  Urinary tract infection with hematuria, site unspecified  Acute appendicitis, unspecified acute appendicitis type  Fever in adult  Neutrophilic leukocytosis  Calculus of gallbladder without cholecystitis without obstruction    New Prescriptions New Prescriptions   No medications on  file     [address] PA-C 08/27/16 2038    Courteney Randall An, MD 08/28/16 2224

## 2016-08-27 NOTE — ED Notes (Signed)
Patient transported to CT 

## 2016-08-27 NOTE — H&P (Signed)
Melissa Beck is an 41 y.o. female.   Chief Complaint: abd pain HPI: 41 yo wf otherwise healthy awoke at 0230 Monday morning feeling ill with right sided abdominal pain. Had nausea, low grade fever, chills. Initially thought flu but abd pain persisted and got worse so thought it may be kidney stone. Went to pcp this am and was directed to ED. RLQ pain constant with n/v. On period. No d/c/hematemesis/dysuria. No discharge. No tob/etoh/drugs. Prior c/s.   Past Medical History:  Diagnosis Date  . Frequency of urination   . History of abnormal cervical Pap smear    s/p  cyro  . History of placenta abruption   . Left ureteral calculus   . Mild acid reflux   . Urgency of urination   . Wears glasses   . Yeast infection of the vagina     Past Surgical History:  Procedure Laterality Date  . CESAREAN SECTION  2008  . CYSTOSCOPY WITH STENT PLACEMENT Left 12/25/2013   Procedure: CYSTOSCOPY WITH STENT PLACEMENT;  Surgeon: Ardis Hughs, MD;  Location: [facility];  Service: Urology;  Laterality: Left;  . CYSTOSCOPY WITH URETEROSCOPY, STONE BASKETRY AND STENT PLACEMENT Left 12/31/2013   Procedure: CYSTOSCOPY LEFT URETEROSCOPY,  LEFT RETROGRADE PYELOGRAM,  LEFT STONE REMOVAL WITH NGAGE BASKET, LEFT URETERAL STENT EXCHANGE ;  Surgeon: Ardis Hughs, MD;  Location: [facility];  Service: Urology;  Laterality: Left;  . LAPAROSCOPIC TUBAL LIGATION Bilateral 05/28/2013   Procedure: LAPAROSCOPIC TUBAL LIGATION WITH CAUTERIZATION;  Surgeon: Princess Bruins, MD;  Location: [facility];  Service: Gynecology;  Laterality: Bilateral;  . WISDOM TOOTH EXTRACTION  2001    Family History  Problem Relation Age of Onset  . COPD Mother   . Thrombocytopenia Daughter    Social History:  reports that she quit smoking about 3 years ago. Her smoking use included Cigarettes. She has a 10.00 pack-year smoking history. She has never used smokeless tobacco. She reports that she does not drink alcohol or use  drugs.  Allergies: No Known Allergies   (Not in a hospital admission)  Results for orders placed or performed during the hospital encounter of 08/27/16 (from the past 48 hour(s))  Urinalysis, Routine w reflex microscopic     Status: Abnormal   Collection Time: 08/27/16  1:53 PM  Result Value Ref Range   Color, Urine AMBER (A) YELLOW    Comment: BIOCHEMICALS MAY BE AFFECTED BY COLOR   APPearance HAZY (A) CLEAR   Specific Gravity, Urine 1.029 1.005 - 1.030   pH 5.0 5.0 - 8.0   Glucose, UA NEGATIVE NEGATIVE mg/dL   Hgb urine dipstick SMALL (A) NEGATIVE   Bilirubin Urine NEGATIVE NEGATIVE   Ketones, ur 20 (A) NEGATIVE mg/dL   Protein, ur 30 (A) NEGATIVE mg/dL   Nitrite NEGATIVE NEGATIVE   Leukocytes, UA NEGATIVE NEGATIVE   RBC / HPF 6-30 0 - 5 RBC/hpf   WBC, UA 0-5 0 - 5 WBC/hpf   Bacteria, UA MANY (A) NONE SEEN   Squamous Epithelial / LPF 0-5 (A) NONE SEEN   Mucous PRESENT   Lipase, blood     Status: None   Collection Time: 08/27/16  2:37 PM  Result Value Ref Range   Lipase 16 11 - 51 U/L  Comprehensive metabolic panel     Status: Abnormal   Collection Time: 08/27/16  2:37 PM  Result Value Ref Range   Sodium 135 135 - 145 mmol/L   Potassium 3.4 (L) 3.5 - 5.1 mmol/L  Chloride 104 101 - 111 mmol/L   CO2 24 22 - 32 mmol/L   Glucose, Bld 109 (H) 65 - 99 mg/dL   BUN 11 6 - 20 mg/dL   Creatinine, Ser 0.91 0.44 - 1.00 mg/dL   Calcium 8.5 (L) 8.9 - 10.3 mg/dL   Total Protein 7.5 6.5 - 8.1 g/dL   Albumin 3.9 3.5 - 5.0 g/dL   AST 17 15 - 41 U/L   ALT 12 (L) 14 - 54 U/L   Alkaline Phosphatase 66 38 - 126 U/L   Total Bilirubin 1.1 0.3 - 1.2 mg/dL   GFR calc non Af Amer >60 >60 mL/min   GFR calc Af Amer >60 >60 mL/min    Comment: (NOTE) The eGFR has been calculated using the CKD EPI equation. This calculation has not been validated in all clinical situations. eGFR's persistently <60 mL/min signify possible Chronic Kidney Disease.    Anion gap 7 5 - 15  CBC     Status:  Abnormal   Collection Time: 08/27/16  2:37 PM  Result Value Ref Range   WBC 13.0 (H) 4.0 - 10.5 K/uL   RBC 4.04 3.87 - 5.11 MIL/uL   Hemoglobin 11.3 (L) 12.0 - 15.0 g/dL   HCT 34.1 (L) 36.0 - 46.0 %   MCV 84.4 78.0 - 100.0 fL   MCH 28.0 26.0 - 34.0 pg   MCHC 33.1 30.0 - 36.0 g/dL   RDW 14.0 11.5 - 15.5 %   Platelets 219 150 - 400 K/uL  Differential     Status: Abnormal   Collection Time: 08/27/16  2:37 PM  Result Value Ref Range   Neutrophils Relative % 84 %   Neutro Abs 11.6 (H) 1.7 - 7.7 K/uL   Lymphocytes Relative 9 %   Lymphs Abs 1.3 0.7 - 4.0 K/uL   Monocytes Relative 7 %   Monocytes Absolute 0.9 0.1 - 1.0 K/uL   Eosinophils Relative 0 %   Eosinophils Absolute 0.0 0.0 - 0.7 K/uL   Basophils Relative 0 %   Basophils Absolute 0.0 0.0 - 0.1 K/uL  I-Stat beta hCG blood, ED ([facility], [facility], [facility] only)     Status: None   Collection Time: 08/27/16  6:39 PM  Result Value Ref Range   I-stat hCG, quantitative <5.0 <5 mIU/mL   Comment 3            Comment:   GEST. AGE      CONC.  (mIU/mL)   <=1 WEEK        5 - 50     2 WEEKS       50 - 500     3 WEEKS       100 - 10,000     4 WEEKS     1,000 - 30,000        FEMALE AND NON-PREGNANT FEMALE:     LESS THAN 5 mIU/mL    Ct Abdomen Pelvis Wo Contrast  Result Date: 08/27/2016 CLINICAL DATA:  Right flank pain for 2 days EXAM: CT ABDOMEN AND PELVIS WITHOUT CONTRAST TECHNIQUE: Multidetector CT imaging of the abdomen and pelvis was performed following the standard protocol without IV contrast. COMPARISON:  12/30/2013, 12/25/2013 FINDINGS: Lower chest: Lung bases are clear.  Heart size is normal. Hepatobiliary: No focal hepatic abnormality. Mild increased density within the dependent portion of gallbladder, may reflect sludge or small stones. No wall thickening. No biliary dilatation. Pancreas: Unremarkable. No pancreatic ductal dilatation or surrounding inflammatory changes. Spleen: Normal  in size without focal abnormality. Adrenals/Urinary Tract: Adrenal  glands are unremarkable. Kidneys are normal, without renal calculi, focal lesion, or hydronephrosis. Bladder is unremarkable. Stomach/Bowel: The stomach is nonenlarged. There is no dilated small bowel. Moderate right lower quadrant inflammation is present. There is thickening of the terminal ileum. The appendix is enlarged, and measures up to 14 mm. Vascular/Lymphatic: Scattered right lower quadrant mesenteric nodes. Non aneurysmal aorta. Reproductive: Uterus and bilateral adnexa are unremarkable. Other: Small free fluid in the pelvis.  No free air. Musculoskeletal: No acute or significant osseous findings. IMPRESSION: 1. Enlarged appendix up to 14 mm. Although there is intraluminal gas present, given the moderate surrounding inflammatory changes, acute appendicitis is suspected. No extraluminal gas or free air. No abscess. 2. Negative for nephrolithiasis or hydronephrosis 3. Possible layering sludge or stones within the gallbladder. Electronically Signed   By: Donavan Foil M.D.   On: 08/27/2016 20:06    Review of Systems  Constitutional: Positive for chills and fever. Negative for weight loss.  HENT: Negative for nosebleeds.   Eyes: Negative for blurred vision.  Respiratory: Negative for shortness of breath.   Cardiovascular: Negative for chest pain, palpitations, orthopnea and PND.       Denies DOE  Gastrointestinal: Positive for abdominal pain, nausea and vomiting.  Genitourinary: Negative for dysuria and hematuria.  Musculoskeletal: Negative.   Skin: Negative for itching and rash.  Neurological: Negative for dizziness, focal weakness, seizures, loss of consciousness and headaches.  Endo/Heme/Allergies: Does not bruise/bleed easily.       No prior blood clots  Psychiatric/Behavioral: The patient is not nervous/anxious.     Blood pressure 115/74, pulse 98, temperature 98.9 F (37.2 C), resp. rate 16, SpO2 99 %. Physical Exam  Vitals reviewed. Constitutional: She is oriented to person,  place, and time. She appears well-developed and well-nourished. No distress.  A little ill appearingl; soft  HENT:  Head: Normocephalic and atraumatic.  Right Ear: External ear normal.  Left Ear: External ear normal.  Eyes: Conjunctivae are normal. No scleral icterus.  Neck: Normal range of motion. Neck supple. No tracheal deviation present. No thyromegaly present.  Cardiovascular: Normal rate and normal heart sounds.   Respiratory: Effort normal and breath sounds normal. No stridor. No respiratory distress. She has no wheezes.  GI: Soft. She exhibits no distension. There is tenderness in the right lower quadrant. There is guarding (voluntary) and tenderness at McBurney's point. There is no rebound.  Musculoskeletal: She exhibits no edema or tenderness.  Lymphadenopathy:    She has no cervical adenopathy.  Neurological: She is alert and oriented to person, place, and time. She exhibits normal muscle tone.  Skin: Skin is warm and dry. No rash noted. She is not diaphoretic. No erythema. No pallor.  Psychiatric: She has a normal mood and affect. Her behavior is normal. Judgment and thought content normal.     Assessment/Plan Acute appendicitis Mild anemia  We discussed the etiology and management of acute appendicitis. We discussed operative and nonoperative management.  I recommended operative management along with IV antibiotics.  We discussed laparoscopic appendectomy. We discussed the risk and benefits of surgery including but not limited to bleeding, infection, injury to surrounding structures, need to convert to an open procedure, blood clot formation, post operative abscess or wound infection, staple line complications such as leak or bleeding, hernia formation, post operative ileus, need for additional procedures, anesthesia complications, and the typical postoperative course. I explained that the patient should expect a good improvement in their symptoms.  Leighton Ruff. Redmond Pulling, MD,  [facility] General, Bariatric, & Minimally Invasive Surgery [facility], [place]   Gayland Curry, MD 08/27/2016, 9:24 PM

## 2016-08-27 NOTE — ED Triage Notes (Signed)
Pt reports R flank pain for the past 2 days. No n/v/d. Went to PCP office who found WBC of 15.2. Also had fever at PCP office. Has not taken anything for this, but temperature normal during triage. Sent here for further eval.

## 2016-08-27 NOTE — ED Notes (Signed)
Patient transferred to OR

## 2016-08-27 NOTE — Anesthesia Procedure Notes (Signed)
Procedure Name: Intubation Date/Time: 08/27/2016 10:13 PM Performed by: Noralyn Pick D Pre-anesthesia Checklist: Patient identified, Emergency Drugs available, Suction available and Patient being monitored Patient Re-evaluated:Patient Re-evaluated prior to inductionOxygen Delivery Method: Circle system utilized Preoxygenation: Pre-oxygenation with 100% oxygen Intubation Type: IV induction, Rapid sequence and Cricoid Pressure applied Laryngoscope Size: Mac and 3 Grade View: Grade I Tube type: Oral Tube size: 7.5 mm Number of attempts: 1 Airway Equipment and Method: Stylet and Oral airway Placement Confirmation: ETT inserted through vocal cords under direct vision,  positive ETCO2 and breath sounds checked- equal and bilateral Secured at: 22 cm Tube secured with: Tape Dental Injury: Teeth and Oropharynx as per pre-operative assessment

## 2016-08-27 NOTE — Op Note (Signed)
Melissa Beck [id number] [birth year] 08/27/2016  Appendectomy, Lap, Procedure Note  Indications: The patient presented with a history of right-sided abdominal pain. A CT revealed findings consistent with acute appendicitis.  Pre-operative Diagnosis: acute appendicitis  Post-operative Diagnosis: gangrenous appendicitis  Surgeon: Atilano Ina   Assistants: none  Anesthesia: General endotracheal anesthesia  Procedure Details  The patient was seen again in the Holding Room. The risks, benefits, complications, treatment options, and expected outcomes were discussed with the patient and/or family. The possibilities of perforation of viscus, bleeding, recurrent infection, the need for additional procedures, failure to diagnose a condition, and creating a complication requiring transfusion or operation were discussed. There was concurrence with the proposed plan and informed consent was obtained. The site of surgery was properly noted. The patient was taken to Operating Room, identified as Melissa Beck and the procedure verified as Appendectomy. A Time Out was held and the above information confirmed.  The patient was placed in the supine position and general anesthesia was induced, along with placement of orogastric tube, SCDs, and a Foley catheter. The abdomen was prepped and draped in a sterile fashion. A 1.5 centimeter infraumbilical incision was made.  The umbilical stalk was elevated, and the midline fascia was incised with a #11 blade.  A Kelly clamp was used to confirm entrance into the peritoneal cavity.  A pursestring suture was passed around the incision with a 0 Vicryl.  A 12mm Hasson was introduced into the abdomen and the tails of the suture were used to hold the Hasson in place.   The pneumoperitoneum was then established to steady pressure of 15 mmHg.  Additional 5 mm cannulas then placed in the left lower quadrant of the abdomen and the suprapubic region under direct visualization. A careful  evaluation of the entire abdomen was carried out. The patient was placed in Trendelenburg and left lateral decubitus position. The small intestines were retracted in the cephalad and left lateral direction away from the pelvis and right lower quadrant. The patient was found to have an inflamed appendix that was extending into the pelvis. There was no evidence of perforation. it was stuck to the right pelvic inlet.    due to the severe inflammation of the mesoappendix I decided to mobilize the appendix that is. The terminal ileum was identified going into the cecum and the appendiceal base was identified. Using a [employer] I was able to dissect the base of the appendix out. The appendix was divided at its base using an endo-GIA stapler with a white load. No appendiceal stump was left in place.  The appendix was carefully dissected. The appendix was was skeletonized with the harmonic scalpel.  The mesial appendix is very friable. There was evidence of some necrosis in the midportion of the appendiceal wall. The appendix was removed from the abdomen with an Ecco bag through the umbilical port.  There was no evidence of  leakage, or complication after division of the appendix. There was some bleeding from the mesoappendix. Hemostasis was achieved with Jola Babinski and electrocautery. Irrigation was also performed and irrigate suctioned from the abdomen as well.  The umbilical port site was closed with the purse string suture. The closure was viewed laparoscopically. There was no residual palpable fascial defect. However given her obesity I decided to place an additional interrupted 0 Vicryl sutures through the umbilical fascia using the PMI suture passer with laparoscopic guidance. The trocar site skin wounds were closed with 4-0 Monocryl. Benzoin, Steri-Strips, and sterile bandages were applied to  the skin incisions.  Instrument, sponge, and needle counts were correct at the conclusion of the case.    Findings: The appendix was found to be inflamed. There were signs of necrosis.  There was not perforation. There was not abscess formation.  Estimated Blood Loss:  Minimal         Drains: None         Specimens: appendix         Complications:  None; patient tolerated the procedure well.         Disposition: PACU - hemodynamically stable.         Condition: stable  Mary SellaEric M. Andrey CampanileWilson, MD, FACS General, Bariatric, & Minimally Invasive Surgery [facility], [place]

## 2016-08-27 NOTE — Transfer of Care (Signed)
Immediate Anesthesia Transfer of Care Note  Patient: Melissa Beck  Procedure(s) Performed: Procedure(s): APPENDECTOMY LAPAROSCOPIC (N/A)  Patient Location: PACU  Anesthesia Type:General  Level of Consciousness: awake, alert  and oriented  Airway & Oxygen Therapy: Patient Spontanous Breathing and Patient connected to face mask oxygen  Post-op Assessment: Report given to RN and Post -op Vital signs reviewed and stable  Post vital signs: Reviewed and stable  Last Vitals:  Vitals:   08/27/16 1732 08/27/16 1930  BP: 126/69 115/74  Pulse: 102 98  Resp: 18 16  Temp:      Last Pain:  Vitals:   08/27/16 2113  PainSc: 7          Complications: No apparent anesthesia complications

## 2016-08-27 NOTE — Anesthesia Preprocedure Evaluation (Addendum)
Anesthesia Evaluation  Patient identified by MRN, date of birth, ID band Patient awake    Reviewed: Allergy & Precautions, H&P , NPO status , Patient's Chart, lab work & pertinent test results  Airway Mallampati: II  TM Distance: >3 FB Neck ROM: Full    Dental no notable dental hx. (+) Teeth Intact, Dental Advisory Given   Pulmonary neg pulmonary ROS, former smoker,    Pulmonary exam normal breath sounds clear to auscultation       Cardiovascular negative cardio ROS   Rhythm:Regular Rate:Normal     Neuro/Psych negative neurological ROS  negative psych ROS   GI/Hepatic negative GI ROS, Neg liver ROS,   Endo/Other  negative endocrine ROS  Renal/GU negative Renal ROS  negative genitourinary   Musculoskeletal   Abdominal   Peds  Hematology negative hematology ROS (+)   Anesthesia Other Findings   Reproductive/Obstetrics negative OB ROS                            Anesthesia Physical Anesthesia Plan  ASA: II and emergent  Anesthesia Plan: General   Post-op Pain Management:    Induction: Intravenous, Rapid sequence and Cricoid pressure planned  Airway Management Planned: Oral ETT  Additional Equipment:   Intra-op Plan:   Post-operative Plan: Extubation in OR  Informed Consent: I have reviewed the patients History and Physical, chart, labs and discussed the procedure including the risks, benefits and alternatives for the proposed anesthesia with the patient or authorized representative who has indicated his/her understanding and acceptance.   Dental advisory given  Plan Discussed with: CRNA  Anesthesia Plan Comments:        Anesthesia Quick Evaluation

## 2016-08-28 ENCOUNTER — Encounter (HOSPITAL_COMMUNITY): Payer: Self-pay | Admitting: General Surgery

## 2016-08-28 LAB — BASIC METABOLIC PANEL
ANION GAP: 5 (ref 5–15)
BUN: 8 mg/dL (ref 6–20)
CALCIUM: 7.7 mg/dL — AB (ref 8.9–10.3)
CO2: 23 mmol/L (ref 22–32)
Chloride: 107 mmol/L (ref 101–111)
Creatinine, Ser: 0.93 mg/dL (ref 0.44–1.00)
GFR calc Af Amer: >60 mL/min (ref >60)
GLUCOSE: 182 mg/dL — AB (ref 65–99)
Potassium: 3.9 mmol/L (ref 3.5–5.1)
SODIUM: 135 mmol/L (ref 135–145)

## 2016-08-28 LAB — CBC
HCT: 33.4 % — ABNORMAL LOW (ref 36.0–46.0)
HEMOGLOBIN: 10.7 g/dL — AB (ref 12.0–15.0)
MCH: 26.7 pg (ref 26.0–34.0)
MCHC: 32 g/dL (ref 30.0–36.0)
MCV: 83.3 fL (ref 78.0–100.0)
Platelets: 190 10*3/uL (ref 150–400)
RBC: 4.01 MIL/uL (ref 3.87–5.11)
RDW: 13.9 % (ref 11.5–15.5)
WBC: 11.6 10*3/uL — AB (ref 4.0–10.5)

## 2016-08-28 MED ORDER — MORPHINE SULFATE (PF) 4 MG/ML IV SOLN
1.0000 mg | INTRAVENOUS | Status: DC | PRN
  Administered 2016-08-28 (×3): 2 mg via INTRAVENOUS
  Filled 2016-08-28 (×3): qty 1

## 2016-08-28 MED ORDER — ACETAMINOPHEN 650 MG RE SUPP: 650.0000 mg | Freq: Four times a day (QID) | RECTAL | Status: DC | PRN

## 2016-08-28 MED ORDER — SIMETHICONE 80 MG PO CHEW: 40.0000 mg | CHEWABLE_TABLET | Freq: Four times a day (QID) | ORAL | Status: DC | PRN

## 2016-08-28 MED ORDER — PROMETHAZINE HCL 25 MG/ML IJ SOLN: 12.5000 mg | Freq: Four times a day (QID) | INTRAMUSCULAR | Status: DC | PRN

## 2016-08-28 MED ORDER — PANTOPRAZOLE SODIUM 40 MG IV SOLR
40.0000 mg | Freq: Every day | INTRAVENOUS | Status: DC
  Administered 2016-08-28: 40 mg via INTRAVENOUS
  Filled 2016-08-28: qty 40

## 2016-08-28 MED ORDER — DIPHENHYDRAMINE HCL 12.5 MG/5ML PO ELIX: 12.5000 mg | ORAL_SOLUTION | Freq: Four times a day (QID) | ORAL | Status: DC | PRN

## 2016-08-28 MED ORDER — HEPARIN SODIUM (PORCINE) 5000 UNIT/ML IJ SOLN
5000.0000 [IU] | Freq: Three times a day (TID) | INTRAMUSCULAR | Status: DC
  Administered 2016-08-28: 09:00:00 5000 [IU] via SUBCUTANEOUS
  Filled 2016-08-28: qty 1

## 2016-08-28 MED ORDER — CEFTRIAXONE SODIUM 2 G IJ SOLR
2.0000 g | INTRAMUSCULAR | Status: DC
  Filled 2016-08-28: qty 2

## 2016-08-28 MED ORDER — ESCITALOPRAM OXALATE 10 MG PO TABS
10.0000 mg | ORAL_TABLET | Freq: Every day | ORAL | Status: DC
  Administered 2016-08-28: 10 mg via ORAL
  Filled 2016-08-28: qty 1

## 2016-08-28 MED ORDER — DIPHENHYDRAMINE HCL 50 MG/ML IJ SOLN: 12.5000 mg | Freq: Four times a day (QID) | INTRAMUSCULAR | Status: DC | PRN

## 2016-08-28 MED ORDER — ACETAMINOPHEN 325 MG PO TABS: 650.0000 mg | ORAL_TABLET | Freq: Four times a day (QID) | ORAL | Status: DC | PRN

## 2016-08-28 MED ORDER — OXYCODONE HCL 5 MG PO TABS
5.0000 mg | ORAL_TABLET | ORAL | Status: DC | PRN
  Administered 2016-08-28 (×2): 5 mg via ORAL
  Filled 2016-08-28 (×3): qty 1

## 2016-08-28 MED ORDER — OXYCODONE HCL 5 MG PO TABS: 5.0000 mg | ORAL_TABLET | ORAL | 0 refills | Status: DC | PRN

## 2016-08-28 MED ORDER — METHOCARBAMOL 500 MG PO TABS: 500.0000 mg | ORAL_TABLET | Freq: Four times a day (QID) | ORAL | Status: DC | PRN

## 2016-08-28 MED ORDER — ONDANSETRON HCL 4 MG/2ML IJ SOLN: 4.0000 mg | Freq: Four times a day (QID) | INTRAMUSCULAR | Status: DC | PRN

## 2016-08-28 MED ORDER — ONDANSETRON 4 MG PO TBDP: 4.0000 mg | ORAL_TABLET | Freq: Four times a day (QID) | ORAL | Status: DC | PRN

## 2016-08-28 MED ORDER — KCL IN DEXTROSE-NACL 20-5-0.45 MEQ/L-%-% IV SOLN
INTRAVENOUS | Status: DC
  Administered 2016-08-28: 01:00:00 via INTRAVENOUS
  Filled 2016-08-28 (×2): qty 1000

## 2016-08-28 MED ORDER — METRONIDAZOLE 500 MG PO TABS
500.0000 mg | ORAL_TABLET | Freq: Three times a day (TID) | ORAL | Status: DC
  Administered 2016-08-28 (×2): 500 mg via ORAL
  Filled 2016-08-28 (×2): qty 1

## 2016-08-28 NOTE — Anesthesia Postprocedure Evaluation (Signed)
Anesthesia Post Note  Patient: Melissa Beck  Procedure(s) Performed: Procedure(s) (LRB): APPENDECTOMY LAPAROSCOPIC (N/A)  Patient location during evaluation: PACU Anesthesia Type: General Level of consciousness: awake and alert Pain management: pain level controlled Vital Signs Assessment: post-procedure vital signs reviewed and stable Respiratory status: spontaneous breathing, nonlabored ventilation and respiratory function stable Cardiovascular status: blood pressure returned to baseline and stable Postop Assessment: no signs of nausea or vomiting Anesthetic complications: no       Last Vitals:  Vitals:   08/28/16 0940 08/28/16 1341  BP: (!) 100/59 (!) 105/52  Pulse: 79 89  Resp: 16 16  Temp: 36.8 C 37.1 C    Last Pain:  Vitals:   08/28/16 1405  TempSrc:   PainSc: 2                  Harm Jou,W. EDMOND

## 2016-08-28 NOTE — Progress Notes (Signed)
1 Day Post-Op  Subjective: Awake and alert.  Feels a little bit better. Tolerating clear liquids Just waking up  Operative findings discussed with patient She wants to try to go home this afternoon if she makes good progress.  Objective: Vital signs in last 24 hours: Temp:  [98.9 F (37.2 C)-100.2 F (37.9 C)] 99.1 F (37.3 C) (02/07 0645) Pulse Rate:  [85-126] 85 (02/07 0645) Resp:  [14-18] 16 (02/07 0645) BP: (107-128)/(59-82) 116/60 (02/07 0645) SpO2:  [94 %-100 %] 94 % (02/07 0645) Last BM Date: 08/27/16  Intake/Output from previous day: 02/06 0701 - 02/07 0700 In: 2527.1 [P.O.:300; I.V.:1127.1; IV Piggyback:1100] Out: 1200 [Urine:1150; Blood:50] Intake/Output this shift: No intake/output data recorded.  General appearance: Alert.  Pleasant.  No distress.  A little sleepy. Resp: clear to auscultation bilaterally GI: Soft.  Not distended.  Appropriately tender.  Hypoactive bowel sounds.  Wounds clean.  Lab Results:  Results for orders placed or performed during the hospital encounter of 08/27/16 (from the past 24 hour(s))  Urinalysis, Routine w reflex microscopic     Status: Abnormal   Collection Time: 08/27/16  1:53 PM  Result Value Ref Range   Color, Urine AMBER (A) YELLOW   APPearance HAZY (A) CLEAR   Specific Gravity, Urine 1.029 1.005 - 1.030   pH 5.0 5.0 - 8.0   Glucose, UA NEGATIVE NEGATIVE mg/dL   Hgb urine dipstick SMALL (A) NEGATIVE   Bilirubin Urine NEGATIVE NEGATIVE   Ketones, ur 20 (A) NEGATIVE mg/dL   Protein, ur 30 (A) NEGATIVE mg/dL   Nitrite NEGATIVE NEGATIVE   Leukocytes, UA NEGATIVE NEGATIVE   RBC / HPF 6-30 0 - 5 RBC/hpf   WBC, UA 0-5 0 - 5 WBC/hpf   Bacteria, UA MANY (A) NONE SEEN   Squamous Epithelial / LPF 0-5 (A) NONE SEEN   Mucous PRESENT   Lipase, blood     Status: None   Collection Time: 08/27/16  2:37 PM  Result Value Ref Range   Lipase 16 11 - 51 U/L  Comprehensive metabolic panel     Status: Abnormal   Collection Time:  08/27/16  2:37 PM  Result Value Ref Range   Sodium 135 135 - 145 mmol/L   Potassium 3.4 (L) 3.5 - 5.1 mmol/L   Chloride 104 101 - 111 mmol/L   CO2 24 22 - 32 mmol/L   Glucose, Bld 109 (H) 65 - 99 mg/dL   BUN 11 6 - 20 mg/dL   Creatinine, Ser 1.61 0.44 - 1.00 mg/dL   Calcium 8.5 (L) 8.9 - 10.3 mg/dL   Total Protein 7.5 6.5 - 8.1 g/dL   Albumin 3.9 3.5 - 5.0 g/dL   AST 17 15 - 41 U/L   ALT 12 (L) 14 - 54 U/L   Alkaline Phosphatase 66 38 - 126 U/L   Total Bilirubin 1.1 0.3 - 1.2 mg/dL   GFR calc non Af Amer >60 >60 mL/min   GFR calc Af Amer >60 >60 mL/min   Anion gap 7 5 - 15  CBC     Status: Abnormal   Collection Time: 08/27/16  2:37 PM  Result Value Ref Range   WBC 13.0 (H) 4.0 - 10.5 K/uL   RBC 4.04 3.87 - 5.11 MIL/uL   Hemoglobin 11.3 (L) 12.0 - 15.0 g/dL   HCT 09.6 (L) 04.5 - 40.9 %   MCV 84.4 78.0 - 100.0 fL   MCH 28.0 26.0 - 34.0 pg   MCHC 33.1 30.0 - 36.0  g/dL   RDW 40.9 81.1 - 91.4 %   Platelets 219 150 - 400 K/uL  Differential     Status: Abnormal   Collection Time: 08/27/16  2:37 PM  Result Value Ref Range   Neutrophils Relative % 84 %   Neutro Abs 11.6 (H) 1.7 - 7.7 K/uL   Lymphocytes Relative 9 %   Lymphs Abs 1.3 0.7 - 4.0 K/uL   Monocytes Relative 7 %   Monocytes Absolute 0.9 0.1 - 1.0 K/uL   Eosinophils Relative 0 %   Eosinophils Absolute 0.0 0.0 - 0.7 K/uL   Basophils Relative 0 %   Basophils Absolute 0.0 0.0 - 0.1 K/uL  I-Stat beta hCG blood, ED ([facility], [facility], [facility] only)     Status: None   Collection Time: 08/27/16  6:39 PM  Result Value Ref Range   I-stat hCG, quantitative <5.0 <5 mIU/mL   Comment 3          Basic metabolic panel     Status: Abnormal   Collection Time: 08/28/16  4:42 AM  Result Value Ref Range   Sodium 135 135 - 145 mmol/L   Potassium 3.9 3.5 - 5.1 mmol/L   Chloride 107 101 - 111 mmol/L   CO2 23 22 - 32 mmol/L   Glucose, Bld 182 (H) 65 - 99 mg/dL   BUN 8 6 - 20 mg/dL   Creatinine, Ser 7.82 0.44 - 1.00 mg/dL   Calcium 7.7 (L) 8.9 -  10.3 mg/dL   GFR calc non Af Amer >60 >60 mL/min   GFR calc Af Amer >60 >60 mL/min   Anion gap 5 5 - 15  CBC     Status: Abnormal   Collection Time: 08/28/16  4:42 AM  Result Value Ref Range   WBC 11.6 (H) 4.0 - 10.5 K/uL   RBC 4.01 3.87 - 5.11 MIL/uL   Hemoglobin 10.7 (L) 12.0 - 15.0 g/dL   HCT 95.6 (L) 21.3 - 08.6 %   MCV 83.3 78.0 - 100.0 fL   MCH 26.7 26.0 - 34.0 pg   MCHC 32.0 30.0 - 36.0 g/dL   RDW 57.8 46.9 - 62.9 %   Platelets 190 150 - 400 K/uL     Studies/Results: Ct Abdomen Pelvis Wo Contrast  Result Date: 08/27/2016 CLINICAL DATA:  Right flank pain for 2 days EXAM: CT ABDOMEN AND PELVIS WITHOUT CONTRAST TECHNIQUE: Multidetector CT imaging of the abdomen and pelvis was performed following the standard protocol without IV contrast. COMPARISON:  12/30/2013, 12/25/2013 FINDINGS: Lower chest: Lung bases are clear.  Heart size is normal. Hepatobiliary: No focal hepatic abnormality. Mild increased density within the dependent portion of gallbladder, may reflect sludge or small stones. No wall thickening. No biliary dilatation. Pancreas: Unremarkable. No pancreatic ductal dilatation or surrounding inflammatory changes. Spleen: Normal in size without focal abnormality. Adrenals/Urinary Tract: Adrenal glands are unremarkable. Kidneys are normal, without renal calculi, focal lesion, or hydronephrosis. Bladder is unremarkable. Stomach/Bowel: The stomach is nonenlarged. There is no dilated small bowel. Moderate right lower quadrant inflammation is present. There is thickening of the terminal ileum. The appendix is enlarged, and measures up to 14 mm. Vascular/Lymphatic: Scattered right lower quadrant mesenteric nodes. Non aneurysmal aorta. Reproductive: Uterus and bilateral adnexa are unremarkable. Other: Small free fluid in the pelvis.  No free air. Musculoskeletal: No acute or significant osseous findings. IMPRESSION: 1. Enlarged appendix up to 14 mm. Although there is intraluminal gas present,  given the moderate surrounding inflammatory changes, acute appendicitis is suspected.  No extraluminal gas or free air. No abscess. 2. Negative for nephrolithiasis or hydronephrosis 3. Possible layering sludge or stones within the gallbladder. Electronically Signed   By: Jasmine PangKim  Fujinaga M.D.   On: 08/27/2016 20:06    . cefTRIAXone (ROCEPHIN)  IV  2 g Intravenous Q24H   And  . metroNIDAZOLE  500 mg Oral Q8H  . escitalopram  10 mg Oral Daily  . heparin subcutaneous  5,000 Units Subcutaneous Q8H  . pantoprazole (PROTONIX) IV  40 mg Intravenous QHS     Assessment/Plan: s/p Procedure(s): APPENDECTOMY LAPAROSCOPIC   9 hours post op - laparoscopic appendectomy for gangrenous but nonruptured appendicitis     Advance diet and activities     Possible discharge this afternoon if she makes good progress     Per Dr. Andrey CampanileWilson, does not need antibiotics at discharge  @PROBHOSP @  LOS: 0 days    Erandi Lemma M 08/28/2016  . .prob

## 2016-08-28 NOTE — Discharge Summary (Signed)
[facility] Discharge Summary   Patient ID: Melissa Patrickrica Mehler MRN: [id number] DOB/AGE: 39/08/1975 39 y.o.  Admit date: 08/27/2016 Discharge date: 08/28/2016  Admitting Diagnosis: Acute appendicitis  Discharge Diagnosis Patient Active Problem List   Diagnosis Date Noted  . Appendicitis 08/27/2016  . Postpartum care following vaginal delivery (10/6) 04/27/2013  . VBAC, delivered (10/6) 04/26/2013  . Previous cesarean section - tolac 04/25/2013    Consultants None  Imaging: Ct Abdomen Pelvis Wo Contrast  Result Date: 08/27/2016 CLINICAL DATA:  Right flank pain for 2 days EXAM: CT ABDOMEN AND PELVIS WITHOUT CONTRAST TECHNIQUE: Multidetector CT imaging of the abdomen and pelvis was performed following the standard protocol without IV contrast. COMPARISON:  12/30/2013, 12/25/2013 FINDINGS: Lower chest: Lung bases are clear.  Heart size is normal. Hepatobiliary: No focal hepatic abnormality. Mild increased density within the dependent portion of gallbladder, may reflect sludge or small stones. No wall thickening. No biliary dilatation. Pancreas: Unremarkable. No pancreatic ductal dilatation or surrounding inflammatory changes. Spleen: Normal in size without focal abnormality. Adrenals/Urinary Tract: Adrenal glands are unremarkable. Kidneys are normal, without renal calculi, focal lesion, or hydronephrosis. Bladder is unremarkable. Stomach/Bowel: The stomach is nonenlarged. There is no dilated small bowel. Moderate right lower quadrant inflammation is present. There is thickening of the terminal ileum. The appendix is enlarged, and measures up to 14 mm. Vascular/Lymphatic: Scattered right lower quadrant mesenteric nodes. Non aneurysmal aorta. Reproductive: Uterus and bilateral adnexa are unremarkable. Other: Small free fluid in the pelvis.  No free air. Musculoskeletal: No acute or significant osseous findings. IMPRESSION: 1. Enlarged appendix up to 14 mm. Although there is intraluminal gas  present, given the moderate surrounding inflammatory changes, acute appendicitis is suspected. No extraluminal gas or free air. No abscess. 2. Negative for nephrolithiasis or hydronephrosis 3. Possible layering sludge or stones within the gallbladder. Electronically Signed   By: Jasmine PangKim  Fujinaga M.D.   On: 08/27/2016 20:06    Procedures Dr. Andrey CampanileWilson (08/27/16) - Laparoscopic Appendectomy  Hospital Course:  Melissa Beck is a 41yo [facility] with acute onset right sided abdominal pain, nausea, fever, and chills.  Workup showed acute appendicitis.  Patient was admitted and underwent procedure listed above.  Tolerated procedure well and was transferred to the floor.  Diet was advanced as tolerated.  On POD1 the patient was voiding well, tolerating diet, ambulating well, pain well controlled, vital signs stable, incisions c/d/i and felt stable for discharge home.  Patient will follow up in our office in 2-3 weeks and knows to call with questions or concerns.  She does not need to be on antibiotics postoperatively.  Physical Exam: General:  Alert, NAD, pleasant, comfortable Pulm: effort normal Abd:  Soft, ND, appropriately tender, incisions C/D/I  Allergies as of 08/28/2016   No Known Allergies     Medication List    TAKE these medications   escitalopram 10 MG tablet Commonly known as:  LEXAPRO Take 10 mg by mouth daily.   ibuprofen 200 MG tablet Commonly known as:  ADVIL,MOTRIN Take 800 mg by mouth every 6 (six) hours as needed for moderate pain.   oxyCODONE 5 MG immediate release tablet Commonly known as:  Oxy IR/ROXICODONE Take 1 tablet (5 mg total) by mouth every 4 (four) hours as needed for moderate pain.   tizanidine 6 MG capsule Commonly known as:  ZANAFLEX Take 6 mg by mouth 3 (three) times daily.        Follow-up Information    [facility] [place].  Go on 09/17/2016.   Specialty:  General Surgery Why:  Your appointment is 09/17/16 at 11AM, please  arrive at least 30 min before your appointment to complete your check in paperwork.  If you are unable to arrive 30 min prior to your appointment time we may have to cancel or reschedule you. Contact information: [address] [place] [address] [phone]          Signed: Edson Snowball, [facility] 08/28/2016, 1:23 PM Pager: [phone] Consults: [phone] Mon-Fri 7:00 am-4:30 pm Sat-Sun 7:00 am-11:30 am

## 2016-08-28 NOTE — Discharge Instructions (Signed)
LAPAROSCOPIC SURGERY: POST OP INSTRUCTIONS  1. DIET: Follow a light bland diet the first 24 hours after arrival home, such as soup, liquids, crackers, etc. Be sure to include lots of fluids daily. Avoid fast food or heavy meals as your are more likely to get nauseated. Eat a low fat the next few days after surgery.  2. Take your usually prescribed home medications unless otherwise directed. 3. PAIN CONTROL:  1. Pain is best controlled by a usual combination of three different methods TOGETHER:  1. Ice/Heat 2. Over the counter pain medication 3. Prescription pain medication 2. Most patients will experience some swelling and bruising around the incisions. Ice packs or heating pads (30-60 minutes up to 6 times a day) will help. Use ice for the first few days to help decrease swelling and bruising, then switch to heat to help relax tight/sore spots and speed recovery. Some people prefer to use ice alone, heat alone, alternating between ice & heat. Experiment to what works for you. Swelling and bruising can take several weeks to resolve.  3. It is helpful to take an over-the-counter pain medication regularly for the first few weeks. Choose one of the following that works best for you:  1. Naproxen (Aleve, etc) Two 220mg  tabs twice a day 2. Ibuprofen (Advil, etc) Three 200mg  tabs four times a day (every meal & bedtime) 3. Acetaminophen (Tylenol, etc) 500-650mg  four times a day (every meal & bedtime) 4. A prescription for pain medication (such as oxycodone, hydrocodone, etc) should be given to you upon discharge. Take your pain medication as prescribed.  1. If you are having problems/concerns with the prescription medicine (does not control pain, nausea, vomiting, rash, itching, etc), please call us [phone] to see if we need to switch you to a different pain medicine that will work better for you and/or control your side effect better. 2. If you need a refill on your pain medication, please contact  your pharmacy. They will contact our office to request authorization. Prescriptions will not be filled after 5 pm or on week-ends. 4. Avoid getting constipated. Between the surgery and the pain medications, it is common to experience some constipation. Increasing fluid intake and taking a fiber supplement (such as Metamucil, Citrucel, FiberCon, MiraLax, etc) 1-2 times a day regularly will usually help prevent this problem from occurring. A mild laxative (prune juice, Milk of Magnesia, MiraLax, etc) should be taken according to package directions if there are no bowel movements after 48 hours.  5. Watch out for diarrhea. If you have many loose bowel movements, simplify your diet to bland foods & liquids for a few days. Stop any stool softeners and decrease your fiber supplement. Switching to mild anti-diarrheal medications (Kayopectate, Pepto Bismol) can help. If this worsens or does not improve, please call us. 6. Wash / shower every day. You may shower over the dressings as they are waterproof. Continue to shower over incision(s) after the dressing is off. 7. Remove your bandages 08/29/16 if they have not already fall off. You may leave the incision open to air. You may replace a dressing/Band-Aid to cover the incision for comfort if you wish.  8. ACTIVITIES as tolerated:  1. You may resume regular (light) daily activities beginning the next day--such as daily self-care, walking, climbing stairs--gradually increasing activities as tolerated. If you can walk 30 minutes without difficulty, it is safe to try more intense activity such as jogging, treadmill, bicycling, low-impact aerobics, swimming, etc. 2. Save the most intensive and  strenuous activity for last such as sit-ups, heavy lifting, contact sports, etc Refrain from any heavy lifting or straining until you are off narcotics for pain control.  3. DO NOT PUSH THROUGH PAIN. Let pain be your guide: If it hurts to do something, don't do it. Pain is your body  warning you to avoid that activity for another week until the pain goes down. 4. You may drive when you are no longer taking prescription pain medication, you can comfortably wear a seatbelt, and you can safely maneuver your car and apply brakes. 5. You may have sexual intercourse when it is comfortable.  9. FOLLOW UP in our office  1. Please call [facility] at [phone] to set up an appointment to see your surgeon in the office for a follow-up appointment approximately 2-3 weeks after your surgery. 2. Make sure that you call for this appointment the day you arrive home to insure a convenient appointment time.      10. IF YOU HAVE DISABILITY OR FAMILY LEAVE FORMS, BRING THEM TO THE               OFFICE FOR PROCESSING.   WHEN TO CALL us [phone]:  1. Poor pain control 2. Reactions / problems with new medications (rash/itching, nausea, etc)  3. Fever over 101.5 F (38.5 C) 4. Inability to urinate 5. Nausea and/or vomiting 6. Worsening swelling or bruising 7. Continued bleeding from incision. 8. Increased pain, redness, or drainage from the incision  The clinic staff is available to answer your questions during regular business hours (8:30am-5pm). Please dont hesitate to call and ask to speak to one of our nurses for clinical concerns.  If you have a medical emergency, go to the nearest emergency room or call 911.  A surgeon from [facility] is always on call at the [facility], [place]  [address], [address], [place], [place] [address] ?  MAIN: [phone] ? TOLL FREE: [phone] ?  FAX [phone]  [email or web address]

## 2016-08-29 LAB — URINE CULTURE

## 2016-09-02 DIAGNOSIS — Z01419 Encounter for gynecological examination (general) (routine) without abnormal findings: Secondary | ICD-10-CM | POA: Diagnosis not present

## 2016-09-02 DIAGNOSIS — Z6833 Body mass index (BMI) 33.0-33.9, adult: Secondary | ICD-10-CM | POA: Diagnosis not present

## 2016-09-02 DIAGNOSIS — B977 Papillomavirus as the cause of diseases classified elsewhere: Secondary | ICD-10-CM | POA: Diagnosis not present

## 2016-09-03 DIAGNOSIS — H1032 Unspecified acute conjunctivitis, left eye: Secondary | ICD-10-CM | POA: Diagnosis not present

## 2016-09-03 DIAGNOSIS — B349 Viral infection, unspecified: Secondary | ICD-10-CM | POA: Diagnosis not present

## 2016-09-03 DIAGNOSIS — R21 Rash and other nonspecific skin eruption: Secondary | ICD-10-CM | POA: Diagnosis not present

## 2016-10-16 DIAGNOSIS — F419 Anxiety disorder, unspecified: Secondary | ICD-10-CM | POA: Diagnosis not present

## 2016-11-28 DIAGNOSIS — M542 Cervicalgia: Secondary | ICD-10-CM | POA: Diagnosis not present

## 2016-11-28 DIAGNOSIS — M9902 Segmental and somatic dysfunction of thoracic region: Secondary | ICD-10-CM | POA: Diagnosis not present

## 2016-11-28 DIAGNOSIS — M9901 Segmental and somatic dysfunction of cervical region: Secondary | ICD-10-CM | POA: Diagnosis not present

## 2016-11-28 DIAGNOSIS — M546 Pain in thoracic spine: Secondary | ICD-10-CM | POA: Diagnosis not present

## 2016-12-04 DIAGNOSIS — M542 Cervicalgia: Secondary | ICD-10-CM | POA: Diagnosis not present

## 2016-12-04 DIAGNOSIS — M9902 Segmental and somatic dysfunction of thoracic region: Secondary | ICD-10-CM | POA: Diagnosis not present

## 2016-12-04 DIAGNOSIS — M546 Pain in thoracic spine: Secondary | ICD-10-CM | POA: Diagnosis not present

## 2016-12-04 DIAGNOSIS — M9901 Segmental and somatic dysfunction of cervical region: Secondary | ICD-10-CM | POA: Diagnosis not present

## 2017-01-09 DIAGNOSIS — M9901 Segmental and somatic dysfunction of cervical region: Secondary | ICD-10-CM | POA: Diagnosis not present

## 2017-01-09 DIAGNOSIS — M9902 Segmental and somatic dysfunction of thoracic region: Secondary | ICD-10-CM | POA: Diagnosis not present

## 2017-01-09 DIAGNOSIS — M546 Pain in thoracic spine: Secondary | ICD-10-CM | POA: Diagnosis not present

## 2017-01-09 DIAGNOSIS — M542 Cervicalgia: Secondary | ICD-10-CM | POA: Diagnosis not present

## 2017-01-27 ENCOUNTER — Ambulatory Visit (INDEPENDENT_AMBULATORY_CARE_PROVIDER_SITE_OTHER): Payer: BLUE CROSS/BLUE SHIELD | Admitting: Obstetrics & Gynecology

## 2017-01-27 ENCOUNTER — Encounter: Payer: Self-pay | Admitting: Obstetrics & Gynecology

## 2017-01-27 VITALS — BP 138/86 | Ht 65.5 in | Wt 208.0 lb

## 2017-01-27 DIAGNOSIS — N393 Stress incontinence (female) (male): Secondary | ICD-10-CM | POA: Diagnosis not present

## 2017-01-29 NOTE — Progress Notes (Signed)
    Melissa Beck 04/09/1976 [id number]        41 y.o.  [id number] Married.  S/P BT/S.  RP:  Severe Stress Urinary Incontinence worsening x a few months  HPI:  Young active woman with 1 previous C/S and 1 SVD 04/2013.  Has severe Urinary incontinence with sneezing, coughing, laughing and exercising.  Limiting her significantly in her daily activities.  Has attempted Kegel exercises on-off without improvement.  No urge incontinence.  No UTI Sx.  No bulging in vagina.  No chronic cough or constipation.  Drinks water.  Very little coffee, tea or chocolate.  No calendar of mictions done.  Last pap normal 2017.  H/O Left Ureteral Stent for 5 mm distal ureteral stone and Removal by Ureteroscopy  12/2013.  Acute Appendicitis- Appendectomy 08/2016.  Past medical history,surgical history, problem list, medications, allergies, family history and social history were all reviewed and documented in the EPIC chart.  Directed ROS with pertinent positives and negatives documented in the history of present illness/assessment and plan.  Exam:  Vitals:   01/27/17 1551  BP: 138/86  Weight: 94.3 kg (208 lb)  Height: 5' 5.5" (1.664 m)   General appearance:  Normal  Gyn exam:  Vulva normal                     No significant Uterine prolapse/CystoRectocele                     Bimanual exam:  Uterus AV, normal volume, NT.  No adnexal mass felt.  Assessment/Plan:  41 y.o. [id number]   1. Urinary, incontinence, stress female Severe SUI without significant Uterine prolapse or Cysto-Rectocele.  No Urge Incontinence.  No UTI Sx.  Decision to refer to Dr Sherron MondayMacdiarmid for evaluation and management.  Physical Therapy at [facility] for Pelvic floor reinforcement and consideration of Sling procedure.  Recommend increasing Kegel Exercises in the meantime and starting a Urinary Calendar, reporting Ins/Outs and circumstances around urinary incontinence events.  Counseling on above issue >50% x 25 minutes.  Melissa DelMarie-Lyne Nadeem Romanoski  MD, 4:05 PM 01/27/2017

## 2017-02-01 NOTE — Patient Instructions (Signed)
1. Urinary, incontinence, stress female Severe SUI without significant Uterine prolapse or Cysto-Rectocele.  No Urge Incontinence.  No UTI Sx.  Decision to refer to Dr Sherron MondayMacdiarmid for evaluation and management.  Physical Therapy at [facility] for Pelvic floor reinforcement and consideration of Sling procedure.  Recommend increasing Kegel Exercises in the meantime and starting a Urinary Calendar, reporting Ins/Outs and circumstances around urinary incontinence events.  Melissa Beck, it was a pleasure to see you today!

## 2017-02-03 ENCOUNTER — Telehealth: Payer: Self-pay | Admitting: *Deleted

## 2017-02-03 NOTE — Telephone Encounter (Signed)
-----   Message from Genia DelMarie-Lyne Lavoie, MD sent at 01/27/2017  4:22 PM EDT ----- Regarding: Refer to Urology Dr Sherron MondayMacdiarmid Severe SUI in spite of Kegels.  Refer to Dr Sherron MondayMacdiarmid.

## 2017-02-03 NOTE — Telephone Encounter (Signed)
Referral faxed to [facility] they will fax me back with time and date. 

## 2017-02-19 NOTE — Telephone Encounter (Signed)
[facility] states pt must call the business office before she can be scheduled.

## 2017-04-11 ENCOUNTER — Telehealth: Payer: Self-pay | Admitting: *Deleted

## 2017-04-11 NOTE — Telephone Encounter (Signed)
Pt called left message in voicemail stating she is not able to be seen at [facility] due to a "billing issue" asked if another urology she could be referred. I left message on her voicemail this is who we recommend in [place] she can find a location I will be more than happy to send records.

## 2017-04-11 NOTE — Telephone Encounter (Signed)
Patient wanted referral to [facility] they asked I fax office notes [phone] they will contact pt to schedule.

## 2017-04-14 DIAGNOSIS — F419 Anxiety disorder, unspecified: Secondary | ICD-10-CM | POA: Diagnosis not present

## 2017-04-28 DIAGNOSIS — R3915 Urgency of urination: Secondary | ICD-10-CM | POA: Diagnosis not present

## 2017-04-28 DIAGNOSIS — N393 Stress incontinence (female) (male): Secondary | ICD-10-CM | POA: Diagnosis not present

## 2017-04-28 DIAGNOSIS — R351 Nocturia: Secondary | ICD-10-CM | POA: Diagnosis not present

## 2017-04-28 DIAGNOSIS — R350 Frequency of micturition: Secondary | ICD-10-CM | POA: Diagnosis not present

## 2017-05-06 DIAGNOSIS — R351 Nocturia: Secondary | ICD-10-CM | POA: Diagnosis not present

## 2017-05-06 DIAGNOSIS — R350 Frequency of micturition: Secondary | ICD-10-CM | POA: Diagnosis not present

## 2017-05-06 DIAGNOSIS — N393 Stress incontinence (female) (male): Secondary | ICD-10-CM | POA: Diagnosis not present

## 2017-05-06 DIAGNOSIS — R3915 Urgency of urination: Secondary | ICD-10-CM | POA: Diagnosis not present

## 2017-05-23 DIAGNOSIS — J0100 Acute maxillary sinusitis, unspecified: Secondary | ICD-10-CM | POA: Diagnosis not present

## 2017-05-23 DIAGNOSIS — R42 Dizziness and giddiness: Secondary | ICD-10-CM | POA: Diagnosis not present

## 2017-10-13 DIAGNOSIS — F419 Anxiety disorder, unspecified: Secondary | ICD-10-CM | POA: Diagnosis not present

## 2017-10-21 ENCOUNTER — Encounter: Payer: Self-pay | Admitting: Obstetrics & Gynecology

## 2017-10-21 ENCOUNTER — Ambulatory Visit (INDEPENDENT_AMBULATORY_CARE_PROVIDER_SITE_OTHER): Payer: BLUE CROSS/BLUE SHIELD | Admitting: Obstetrics & Gynecology

## 2017-10-21 VITALS — BP 122/78 | Ht 65.5 in | Wt 210.0 lb

## 2017-10-21 DIAGNOSIS — Z6834 Body mass index (BMI) 34.0-34.9, adult: Secondary | ICD-10-CM

## 2017-10-21 DIAGNOSIS — Z9851 Tubal ligation status: Secondary | ICD-10-CM

## 2017-10-21 DIAGNOSIS — E6609 Other obesity due to excess calories: Secondary | ICD-10-CM | POA: Diagnosis not present

## 2017-10-21 DIAGNOSIS — Z1151 Encounter for screening for human papillomavirus (HPV): Secondary | ICD-10-CM

## 2017-10-21 DIAGNOSIS — Z01419 Encounter for gynecological examination (general) (routine) without abnormal findings: Secondary | ICD-10-CM

## 2017-10-21 DIAGNOSIS — N393 Stress incontinence (female) (male): Secondary | ICD-10-CM

## 2017-10-21 NOTE — Progress Notes (Signed)
Melissa Beck 02/17/1976 [id number]   History:    42 y.o. G3P3L3  Married.  S/P Tubal Ligation  RP:  Established patient presenting for annual gyn exam   HPI: Menses regular every month, normal flow.  No pelvic pain.  No pain with IC.  Seen for SUI 01/2017.  [facility] [place]  Urodynamic testing done.  Started PT, but will do more when has more time.  Declined Sling procedure for now.  Will use a pad for fitness.  Trying to loose weight.  BMI 34.41.  Breasts wnl. Health labs with Fam MD.  Past medical history,surgical history, family history and social history were all reviewed and documented in the EPIC chart.  Gynecologic History Patient's last menstrual period was 09/17/2017. Contraception: tubal ligation Last Pap: 2018. Results were: normal Last mammogram: Never Bone Density: Never Colonoscopy: Never  Obstetric History OB History  Gravida Para Term Preterm AB Living  3 3 2 1   3   SAB TAB Ectopic Multiple Live Births          3    # Outcome Date GA Lbr Len/2nd Weight Sex Delivery Anes PTL Lv  3 Term 04/26/13 3256w5d 28:05 / 01:21 8 lb 2.7 oz (3.705 kg) M VBAC EPI  LIV  2 Preterm 2006 7136w0d 01:00 4 lb 6 oz (1.984 kg) M CS-LTranv EPI  LIV  1 Term 1998 255w0d 05:00 6 lb 14 oz (3.118 kg) F Vag-Spont EPI  LIV     ROS: A ROS was performed and pertinent positives and negatives are included in the history.  GENERAL: No fevers or chills. HEENT: No change in vision, no earache, sore throat or sinus congestion. NECK: No pain or stiffness. CARDIOVASCULAR: No chest pain or pressure. No palpitations. PULMONARY: No shortness of breath, cough or wheeze. GASTROINTESTINAL: No abdominal pain, nausea, vomiting or diarrhea, melena or bright red blood per rectum. GENITOURINARY: No urinary frequency, urgency, hesitancy or dysuria. MUSCULOSKELETAL: No joint or muscle pain, no back pain, no recent trauma. DERMATOLOGIC: No rash, no itching, no lesions. ENDOCRINE: No polyuria, polydipsia, no heat or  cold intolerance. No recent change in weight. HEMATOLOGICAL: No anemia or easy bruising or bleeding. NEUROLOGIC: No headache, seizures, numbness, tingling or weakness. PSYCHIATRIC: No depression, no loss of interest in normal activity or change in sleep pattern.     Exam:   BP 122/78   Ht 5' 5.5" (1.664 m)   Wt 210 lb (95.3 kg)   LMP 09/17/2017 Comment: tubal ligation   BMI 34.41 kg/m   Body mass index is 34.41 kg/m.  General appearance : Well developed well nourished female. No acute distress HEENT: Eyes: no retinal hemorrhage or exudates,  Neck supple, trachea midline, no carotid bruits, no thyroidmegaly Lungs: Clear to auscultation, no rhonchi or wheezes, or rib retractions  Heart: Regular rate and rhythm, no murmurs or gallops Breast:Examined in sitting and supine position were symmetrical in appearance, no palpable masses or tenderness,  no skin retraction, no nipple inversion, no nipple discharge, no skin discoloration, no axillary or supraclavicular lymphadenopathy Abdomen: no palpable masses or tenderness, no rebound or guarding Extremities: no edema or skin discoloration or tenderness  Pelvic: Vulva: Normal             Vagina: No gross lesions or discharge  Cervix: No gross lesions or discharge.  Pap/HR HPV done  Uterus  AV, normal size, shape and consistency, non-tender and mobile  Adnexa  Without masses or tenderness  Anus: Normal  Assessment/Plan:  42 y.o. female for annual exam   1. Encounter for routine gynecological examination with Papanicolaou smear of cervix Normal gynecologic exam.  Pap test with high risk HPV done.  Breast exam normal.  Will start screening mammogram at age 43.  Health labs with family physician.  2. Status post tubal ligation  3. SUI (stress urinary incontinence, female) Patient seen by a urologist in [facility].  She has made the decision to pursue physical therapy to improve her stress urinary incontinence.  She will also work on her  weight loss.  4. Class 1 obesity due to excess calories without serious comorbidity with body mass index (BMI) of 34.0 to 34.9 in adult Low calorie/carb diet recommended.  [employer] is a good example of that.  Regular aerobic physical activity 5 times a week and weightlifting every 2 days recommended.  Genia Del MD, 4:23 PM 10/21/2017

## 2017-10-22 ENCOUNTER — Encounter: Payer: Self-pay | Admitting: Obstetrics & Gynecology

## 2017-10-22 NOTE — Patient Instructions (Signed)
1. Encounter for routine gynecological examination with Papanicolaou smear of cervix Normal gynecologic exam.  Pap test with high risk HPV done.  Breast exam normal.  Will start screening mammogram at age 42.  Health labs with family physician.  2. Status post tubal ligation  3. SUI (stress urinary incontinence, female) Patient seen by a urologist in [facility].  She has made the decision to pursue physical therapy to improve her stress urinary incontinence.  She will also work on her weight loss.  4. Class 1 obesity due to excess calories without serious comorbidity with body mass index (BMI) of 34.0 to 34.9 in adult Low calorie/carb diet recommended.  [employer] is a good example of that.  Regular aerobic physical activity 5 times a week and weightlifting every 2 days recommended.  Artia, it was a pleasure seeing you today!  I will inform you of your results as soon as they are available.  Health Maintenance, Female Adopting a healthy lifestyle and getting preventive care can go a long way to promote health and wellness. Talk with your health care provider about what schedule of regular examinations is right for you. This is a good chance for you to check in with your provider about disease prevention and staying healthy. In between checkups, there are plenty of things you can do on your own. Experts have done a lot of research about which lifestyle changes and preventive measures are most likely to keep you healthy. Ask your health care provider for more information. Weight and diet Eat a healthy diet  Be sure to include plenty of vegetables, fruits, low-fat dairy products, and lean protein.  Do not eat a lot of foods high in solid fats, added sugars, or salt.  Get regular exercise. This is one of the most important things you can do for your health. ? Most adults should exercise for at least 150 minutes each week. The exercise should increase your heart rate and make you sweat  (moderate-intensity exercise). ? Most adults should also do strengthening exercises at least twice a week. This is in addition to the moderate-intensity exercise.  Maintain a healthy weight  Body mass index (BMI) is a measurement that can be used to identify possible weight problems. It estimates body fat based on height and weight. Your health care provider can help determine your BMI and help you achieve or maintain a healthy weight.  For females 71 years of age and older: ? A BMI below 18.5 is considered underweight. ? A BMI of 18.5 to 24.9 is normal. ? A BMI of 25 to 29.9 is considered overweight. ? A BMI of 30 and above is considered obese.  Watch levels of cholesterol and blood lipids  You should start having your blood tested for lipids and cholesterol at 40 years of age, then have this test every 5 years.  You may need to have your cholesterol levels checked more often if: ? Your lipid or cholesterol levels are high. ? You are older than 40 years of age. ? You are at high risk for heart disease.  Cancer screening Lung Cancer  Lung cancer screening is recommended for adults 62-72 years old who are at high risk for lung cancer because of a history of smoking.  A yearly low-dose CT scan of the lungs is recommended for people who: ? Currently smoke. ? Have quit within the past 15 years. ? Have at least a 30-pack-year history of smoking. A pack year is smoking an  average of one pack of cigarettes a day for 1 year.  Yearly screening should continue until it has been 15 years since you quit.  Yearly screening should stop if you develop a health problem that would prevent you from having lung cancer treatment.  Breast Cancer  Practice breast self-awareness. This means understanding how your breasts normally appear and feel.  It also means doing regular breast self-exams. Let your health care provider know about any changes, no matter how small.  If you are in your 20s or  30s, you should have a clinical breast exam (CBE) by a health care provider every 1-3 years as part of a regular health exam.  If you are 59 or older, have a CBE every year. Also consider having a breast X-ray (mammogram) every year.  If you have a family history of breast cancer, talk to your health care provider about genetic screening.  If you are at high risk for breast cancer, talk to your health care provider about having an MRI and a mammogram every year.  Breast cancer gene (BRCA) assessment is recommended for women who have family members with BRCA-related cancers. BRCA-related cancers include: ? Breast. ? Ovarian. ? Tubal. ? Peritoneal cancers.  Results of the assessment will determine the need for genetic counseling and BRCA1 and BRCA2 testing.  Cervical Cancer Your health care provider may recommend that you be screened regularly for cancer of the pelvic organs (ovaries, uterus, and vagina). This screening involves a pelvic examination, including checking for microscopic changes to the surface of your cervix (Pap test). You may be encouraged to have this screening done every 3 years, beginning at age 63.  For women ages 14-65, health care providers may recommend pelvic exams and Pap testing every 3 years, or they may recommend the Pap and pelvic exam, combined with testing for human papilloma virus (HPV), every 5 years. Some types of HPV increase your risk of cervical cancer. Testing for HPV may also be done on women of any age with unclear Pap test results.  Other health care providers may not recommend any screening for nonpregnant women who are considered low risk for pelvic cancer and who do not have symptoms. Ask your health care provider if a screening pelvic exam is right for you.  If you have had past treatment for cervical cancer or a condition that could lead to cancer, you need Pap tests and screening for cancer for at least 20 years after your treatment. If Pap tests  have been discontinued, your risk factors (such as having a new sexual partner) need to be reassessed to determine if screening should resume. Some women have medical problems that increase the chance of getting cervical cancer. In these cases, your health care provider may recommend more frequent screening and Pap tests.  Colorectal Cancer  This type of cancer can be detected and often prevented.  Routine colorectal cancer screening usually begins at 40 years of age and continues through 40 years of age.  Your health care provider may recommend screening at an earlier age if you have risk factors for colon cancer.  Your health care provider may also recommend using home test kits to check for hidden blood in the stool.  A small camera at the end of a tube can be used to examine your colon directly (sigmoidoscopy or colonoscopy). This is done to check for the earliest forms of colorectal cancer.  Routine screening usually begins at age 72.  Direct examination of the  colon should be repeated every 5-10 years through 40 years of age. However, you may need to be screened more often if early forms of precancerous polyps or small growths are found.  Skin Cancer  Check your skin from head to toe regularly.  Tell your health care provider about any new moles or changes in moles, especially if there is a change in a mole's shape or color.  Also tell your health care provider if you have a mole that is larger than the size of a pencil eraser.  Always use sunscreen. Apply sunscreen liberally and repeatedly throughout the day.  Protect yourself by wearing long sleeves, pants, a wide-brimmed hat, and sunglasses whenever you are outside.  Heart disease, diabetes, and high blood pressure  High blood pressure causes heart disease and increases the risk of stroke. High blood pressure is more likely to develop in: ? People who have blood pressure in the high end of the normal range (130-139/85-89 mm  Hg). ? People who are overweight or obese. ? People who are African American.  If you are 10-12 years of age, have your blood pressure checked every 3-5 years. If you are 31 years of age or older, have your blood pressure checked every year. You should have your blood pressure measured twice-once when you are at a hospital or clinic, and once when you are not at a hospital or clinic. Record the average of the two measurements. To check your blood pressure when you are not at a hospital or clinic, you can use: ? An automated blood pressure machine at a pharmacy. ? A home blood pressure monitor.  If you are between 76 years and 60 years old, ask your health care provider if you should take aspirin to prevent strokes.  Have regular diabetes screenings. This involves taking a blood sample to check your fasting blood sugar level. ? If you are at a normal weight and have a low risk for diabetes, have this test once every three years after 40 years of age. ? If you are overweight and have a high risk for diabetes, consider being tested at a younger age or more often. Preventing infection Hepatitis B  If you have a higher risk for hepatitis B, you should be screened for this virus. You are considered at high risk for hepatitis B if: ? You were born in a country where hepatitis B is common. Ask your health care provider which countries are considered high risk. ? Your parents were born in a high-risk country, and you have not been immunized against hepatitis B (hepatitis B vaccine). ? You have HIV or AIDS. ? You use needles to inject street drugs. ? You live with someone who has hepatitis B. ? You have had sex with someone who has hepatitis B. ? You get hemodialysis treatment. ? You take certain medicines for conditions, including cancer, organ transplantation, and autoimmune conditions.  Hepatitis C  Blood testing is recommended for: ? Everyone born from [birth year] through 1963. ? Anyone with known  risk factors for hepatitis C.  Sexually transmitted infections (STIs)  You should be screened for sexually transmitted infections (STIs) including gonorrhea and chlamydia if: ? You are sexually active and are younger than 40 years of age. ? You are older than 40 years of age and your health care provider tells you that you are at risk for this type of infection. ? Your sexual activity has changed since you were last screened and you are at an  increased risk for chlamydia or gonorrhea. Ask your health care provider if you are at risk.  If you do not have HIV, but are at risk, it may be recommended that you take a prescription medicine daily to prevent HIV infection. This is called pre-exposure prophylaxis (PrEP). You are considered at risk if: ? You are sexually active and do not regularly use condoms or know the HIV status of your partner(s). ? You take drugs by injection. ? You are sexually active with a partner who has HIV.  Talk with your health care provider about whether you are at high risk of being infected with HIV. If you choose to begin PrEP, you should first be tested for HIV. You should then be tested every 3 months for as long as you are taking PrEP. Pregnancy  If you are premenopausal and you may become pregnant, ask your health care provider about preconception counseling.  If you may become pregnant, take 400 to 800 micrograms (mcg) of folic acid every day.  If you want to prevent pregnancy, talk to your health care provider about birth control (contraception). Osteoporosis and menopause  Osteoporosis is a disease in which the bones lose minerals and strength with aging. This can result in serious bone fractures. Your risk for osteoporosis can be identified using a bone density scan.  If you are 100 years of age or older, or if you are at risk for osteoporosis and fractures, ask your health care provider if you should be screened.  Ask your health care provider whether you  should take a calcium or vitamin D supplement to lower your risk for osteoporosis.  Menopause may have certain physical symptoms and risks.  Hormone replacement therapy may reduce some of these symptoms and risks. Talk to your health care provider about whether hormone replacement therapy is right for you. Follow these instructions at home:  Schedule regular health, dental, and eye exams.  Stay current with your immunizations.  Do not use any tobacco products including cigarettes, chewing tobacco, or electronic cigarettes.  If you are pregnant, do not drink alcohol.  If you are breastfeeding, limit how much and how often you drink alcohol.  Limit alcohol intake to no more than 1 drink per day for nonpregnant women. One drink equals 12 ounces of beer, 5 ounces of wine, or 1 ounces of hard liquor.  Do not use street drugs.  Do not share needles.  Ask your health care provider for help if you need support or information about quitting drugs.  Tell your health care provider if you often feel depressed.  Tell your health care provider if you have ever been abused or do not feel safe at home. This information is not intended to replace advice given to you by your health care provider. Make sure you discuss any questions you have with your health care provider. Document Released: 01/21/2011 Document Revised: 12/14/2015 Document Reviewed: 04/11/2015 Elsevier Interactive Patient Education  [employer].

## 2017-10-23 LAB — PAP, TP IMAGING W/ HPV RNA, RFLX HPV TYPE 16,18/45: HPV DNA HIGH RISK: NOT DETECTED

## 2018-04-15 DIAGNOSIS — F419 Anxiety disorder, unspecified: Secondary | ICD-10-CM | POA: Diagnosis not present

## 2018-05-05 DIAGNOSIS — M26609 Unspecified temporomandibular joint disorder, unspecified side: Secondary | ICD-10-CM | POA: Diagnosis not present

## 2018-09-22 DIAGNOSIS — M25511 Pain in right shoulder: Secondary | ICD-10-CM | POA: Diagnosis not present

## 2018-10-14 DIAGNOSIS — F419 Anxiety disorder, unspecified: Secondary | ICD-10-CM | POA: Diagnosis not present

## 2018-10-26 ENCOUNTER — Other Ambulatory Visit: Payer: Self-pay

## 2018-10-27 ENCOUNTER — Encounter: Payer: BLUE CROSS/BLUE SHIELD | Admitting: Obstetrics & Gynecology

## 2018-10-27 ENCOUNTER — Encounter: Payer: Self-pay | Admitting: Obstetrics & Gynecology

## 2018-10-27 ENCOUNTER — Ambulatory Visit (INDEPENDENT_AMBULATORY_CARE_PROVIDER_SITE_OTHER): Payer: BLUE CROSS/BLUE SHIELD | Admitting: Obstetrics & Gynecology

## 2018-10-27 VITALS — BP 126/84 | Ht 65.5 in | Wt 223.0 lb

## 2018-10-27 DIAGNOSIS — Z9851 Tubal ligation status: Secondary | ICD-10-CM | POA: Diagnosis not present

## 2018-10-27 DIAGNOSIS — N898 Other specified noninflammatory disorders of vagina: Secondary | ICD-10-CM

## 2018-10-27 DIAGNOSIS — R82998 Other abnormal findings in urine: Secondary | ICD-10-CM | POA: Diagnosis not present

## 2018-10-27 DIAGNOSIS — Z01419 Encounter for gynecological examination (general) (routine) without abnormal findings: Secondary | ICD-10-CM

## 2018-10-27 DIAGNOSIS — E6609 Other obesity due to excess calories: Secondary | ICD-10-CM

## 2018-10-27 DIAGNOSIS — Z6836 Body mass index (BMI) 36.0-36.9, adult: Secondary | ICD-10-CM

## 2018-10-27 LAB — WET PREP FOR TRICH, YEAST, CLUE

## 2018-10-27 MED ORDER — TINIDAZOLE 500 MG PO TABS: 2.0000 g | ORAL_TABLET | Freq: Every day | ORAL | 0 refills | Status: AC

## 2018-10-27 NOTE — Patient Instructions (Signed)
1. Well female exam with routine gynecological exam Normal gynecologic exam except for bacterial vaginosis.  Pap test negative with negative high-risk HPV April 2019, no indication to repeat this year.  Breast exam normal.  Will schedule a screening mammogram now.  Health labs with family physician.  2. S/P tubal ligation  3. Vaginal odor Bacterial vaginosis confirmed by wet prep.  Will treat with tinidazole.  Usage reviewed and prescription sent to pharmacy.  4. Dark urine Urine analysis within normal limits except for few bacteria and presence of clue cells.  Will treat with tinidazole.  Additional treatment per urine culture.  5. Class 2 obesity due to excess calories without serious comorbidity with body mass index (BMI) of 36.0 to 36.9 in adult Recommend lower calorie/carb diet such as [employer].  Recommend intermittent fasting, counseling done.  Aerobic physical activities 5 times a week and weightlifting every 2 days.  Other orders - tinidazole (TINDAMAX) 500 MG tablet; Take 4 tablets (2,000 mg total) by mouth daily for 2 days.  Melissa Beck, it was a pleasure seeing you today!  I will inform you of your results as soon as they are available.

## 2018-10-27 NOTE — Progress Notes (Signed)
Melissa Beck Jun 02, 1976 [id number]   History:    43 y.o. G3P3L3 married  RP:  Established patient presenting for annual gyn exam   HPI: Menses regular normal every month.  No pelvic pain.  C/O vaginal odor with IC per husband.  No pain with IC.  No UTI Sx, no SUI.  Urine appears dark per patient.  BMs normal.  Breasts normal.  BMI 36.54.  Needs to exercise more.  Health labs with Fam MD.  Past medical history,surgical history, family history and social history were all reviewed and documented in the EPIC chart.  Gynecologic History Patient's last menstrual period was 10/10/2018. Contraception: tubal ligation Last Pap: 10/2017. Results were: Negative/HPV HR neg Last mammogram: Never Bone Density: Never Colonoscopy: Never  Obstetric History OB History  Gravida Para Term Preterm AB Living  3 3 2 1   3   SAB TAB Ectopic Multiple Live Births          3    # Outcome Date GA Lbr Len/2nd Weight Sex Delivery Anes PTL Lv  3 Term 04/26/13 [redacted]w[redacted]d 28:05 / 01:21 8 lb 2.7 oz (3.705 kg) M VBAC EPI  LIV  2 Preterm 2006 [redacted]w[redacted]d 01:00 4 lb 6 oz (1.984 kg) M CS-LTranv EPI  LIV  1 Term 1998 [redacted]w[redacted]d 05:00 6 lb 14 oz (3.118 kg) F Vag-Spont EPI  LIV     ROS: A ROS was performed and pertinent positives and negatives are included in the history.  GENERAL: No fevers or chills. HEENT: No change in vision, no earache, sore throat or sinus congestion. NECK: No pain or stiffness. CARDIOVASCULAR: No chest pain or pressure. No palpitations. PULMONARY: No shortness of breath, cough or wheeze. GASTROINTESTINAL: No abdominal pain, nausea, vomiting or diarrhea, melena or bright red blood per rectum. GENITOURINARY: No urinary frequency, urgency, hesitancy or dysuria. MUSCULOSKELETAL: No joint or muscle pain, no back pain, no recent trauma. DERMATOLOGIC: No rash, no itching, no lesions. ENDOCRINE: No polyuria, polydipsia, no heat or cold intolerance. No recent change in weight. HEMATOLOGICAL: No anemia or easy bruising or  bleeding. NEUROLOGIC: No headache, seizures, numbness, tingling or weakness. PSYCHIATRIC: No depression, no loss of interest in normal activity or change in sleep pattern.     Exam:   BP 126/84   Ht 5' 5.5" (1.664 m)   Wt 223 lb (101.2 kg)   LMP 10/10/2018 Comment: tubal ligation  BMI 36.54 kg/m   Body mass index is 36.54 kg/m.  General appearance : Well developed well nourished female. No acute distress HEENT: Eyes: no retinal hemorrhage or exudates,  Neck supple, trachea midline, no carotid bruits, no thyroidmegaly Lungs: Clear to auscultation, no rhonchi or wheezes, or rib retractions  Heart: Regular rate and rhythm, no murmurs or gallops Breast:Examined in sitting and supine position were symmetrical in appearance, no palpable masses or tenderness,  no skin retraction, no nipple inversion, no nipple discharge, no skin discoloration, no axillary or supraclavicular lymphadenopathy Abdomen: no palpable masses or tenderness, no rebound or guarding Extremities: no edema or skin discoloration or tenderness  Pelvic: Vulva: Normal             Vagina: No gross lesions or discharge.  Wet prep done.  Cervix: No gross lesions or discharge  Uterus  AV, normal size, shape and consistency, non-tender and mobile  Adnexa  Without masses or tenderness  Anus: Normal  Wet prep: Clue cells present  U/A: Yellow clear, proteins negative, nitrites negative, white blood cells negative, red blood cells  negative, few bacteria, few clue cells.  Urine culture pending.   Assessment/Plan:  43 y.o. female for annual exam   1. Well female exam with routine gynecological exam Normal gynecologic exam except for bacterial vaginosis.  Pap test negative with negative high-risk HPV April 2019, no indication to repeat this year.  Breast exam normal.  Will schedule a screening mammogram now.  Health labs with family physician.  2. S/P tubal ligation  3. Vaginal odor Bacterial vaginosis confirmed by wet prep.   Will treat with tinidazole.  Usage reviewed and prescription sent to pharmacy.  4. Dark urine Urine analysis within normal limits except for few bacteria and presence of clue cells.  Will treat with tinidazole.  Additional treatment per urine culture.  5. Class 2 obesity due to excess calories without serious comorbidity with body mass index (BMI) of 36.0 to 36.9 in adult Recommend lower calorie/carb diet such as [employer] diet.  Recommend intermittent fasting, counseling done.  Aerobic physical activities 5 times a week and weightlifting every 2 days.  Other orders - tinidazole (TINDAMAX) 500 MG tablet; Take 4 tablets (2,000 mg total) by mouth daily for 2 days.  Genia DelMarie-Lyne Cheyanna Strick MD, 8:40 AM 10/27/2018

## 2018-10-29 LAB — URINALYSIS, COMPLETE W/RFL CULTURE
Bilirubin Urine: NEGATIVE
Glucose, UA: NEGATIVE
Hgb urine dipstick: NEGATIVE
Hyaline Cast: NONE SEEN /LPF
Ketones, ur: NEGATIVE
Leukocyte Esterase: NEGATIVE
Nitrites, Initial: NEGATIVE
Protein, ur: NEGATIVE
RBC / HPF: NONE SEEN /HPF (ref 0–2)
Specific Gravity, Urine: 1.023 (ref 1.001–1.03)
WBC, UA: NONE SEEN /HPF (ref 0–5)
pH: 5.5 (ref 5.0–8.0)

## 2018-10-29 LAB — CULTURE INDICATED

## 2018-10-29 LAB — URINE CULTURE
MICRO NUMBER:: 383120
Result:: NO GROWTH
SPECIMEN QUALITY:: ADEQUATE

## 2019-02-01 ENCOUNTER — Other Ambulatory Visit: Payer: Self-pay | Admitting: Obstetrics & Gynecology

## 2019-02-01 DIAGNOSIS — Z1231 Encounter for screening mammogram for malignant neoplasm of breast: Secondary | ICD-10-CM

## 2019-02-04 DIAGNOSIS — M25562 Pain in left knee: Secondary | ICD-10-CM | POA: Diagnosis not present

## 2019-02-04 DIAGNOSIS — M25561 Pain in right knee: Secondary | ICD-10-CM | POA: Diagnosis not present

## 2019-02-09 ENCOUNTER — Ambulatory Visit: Payer: BLUE CROSS/BLUE SHIELD | Admitting: Sports Medicine

## 2019-03-16 ENCOUNTER — Ambulatory Visit
Admission: RE | Admit: 2019-03-16 | Discharge: 2019-03-16 | Disposition: A | Discharge status: Home / Self Care | Payer: BC Managed Care – PPO | Source: Ambulatory Visit | Attending: Obstetrics & Gynecology | Admitting: Obstetrics & Gynecology

## 2019-03-16 ENCOUNTER — Other Ambulatory Visit: Payer: Self-pay

## 2019-03-16 DIAGNOSIS — Z1231 Encounter for screening mammogram for malignant neoplasm of breast: Secondary | ICD-10-CM | POA: Diagnosis not present

## 2019-04-16 DIAGNOSIS — Z0000 Encounter for general adult medical examination without abnormal findings: Secondary | ICD-10-CM | POA: Diagnosis not present

## 2019-08-18 DIAGNOSIS — M50323 Other cervical disc degeneration at C6-C7 level: Secondary | ICD-10-CM | POA: Diagnosis not present

## 2019-08-18 DIAGNOSIS — M5384 Other specified dorsopathies, thoracic region: Secondary | ICD-10-CM | POA: Diagnosis not present

## 2019-08-18 DIAGNOSIS — M9902 Segmental and somatic dysfunction of thoracic region: Secondary | ICD-10-CM | POA: Diagnosis not present

## 2019-08-18 DIAGNOSIS — M9901 Segmental and somatic dysfunction of cervical region: Secondary | ICD-10-CM | POA: Diagnosis not present

## 2019-08-19 DIAGNOSIS — M9902 Segmental and somatic dysfunction of thoracic region: Secondary | ICD-10-CM | POA: Diagnosis not present

## 2019-08-19 DIAGNOSIS — M5384 Other specified dorsopathies, thoracic region: Secondary | ICD-10-CM | POA: Diagnosis not present

## 2019-08-19 DIAGNOSIS — M9901 Segmental and somatic dysfunction of cervical region: Secondary | ICD-10-CM | POA: Diagnosis not present

## 2019-08-19 DIAGNOSIS — M50323 Other cervical disc degeneration at C6-C7 level: Secondary | ICD-10-CM | POA: Diagnosis not present

## 2019-08-23 DIAGNOSIS — M5384 Other specified dorsopathies, thoracic region: Secondary | ICD-10-CM | POA: Diagnosis not present

## 2019-08-23 DIAGNOSIS — M9901 Segmental and somatic dysfunction of cervical region: Secondary | ICD-10-CM | POA: Diagnosis not present

## 2019-08-23 DIAGNOSIS — M50323 Other cervical disc degeneration at C6-C7 level: Secondary | ICD-10-CM | POA: Diagnosis not present

## 2019-08-23 DIAGNOSIS — M9902 Segmental and somatic dysfunction of thoracic region: Secondary | ICD-10-CM | POA: Diagnosis not present

## 2019-08-30 DIAGNOSIS — M9901 Segmental and somatic dysfunction of cervical region: Secondary | ICD-10-CM | POA: Diagnosis not present

## 2019-08-30 DIAGNOSIS — M50323 Other cervical disc degeneration at C6-C7 level: Secondary | ICD-10-CM | POA: Diagnosis not present

## 2019-08-30 DIAGNOSIS — M9902 Segmental and somatic dysfunction of thoracic region: Secondary | ICD-10-CM | POA: Diagnosis not present

## 2019-08-30 DIAGNOSIS — M5384 Other specified dorsopathies, thoracic region: Secondary | ICD-10-CM | POA: Diagnosis not present

## 2019-09-06 DIAGNOSIS — M50323 Other cervical disc degeneration at C6-C7 level: Secondary | ICD-10-CM | POA: Diagnosis not present

## 2019-09-06 DIAGNOSIS — M5384 Other specified dorsopathies, thoracic region: Secondary | ICD-10-CM | POA: Diagnosis not present

## 2019-09-06 DIAGNOSIS — M9902 Segmental and somatic dysfunction of thoracic region: Secondary | ICD-10-CM | POA: Diagnosis not present

## 2019-09-06 DIAGNOSIS — M9901 Segmental and somatic dysfunction of cervical region: Secondary | ICD-10-CM | POA: Diagnosis not present

## 2019-09-20 DIAGNOSIS — M50323 Other cervical disc degeneration at C6-C7 level: Secondary | ICD-10-CM | POA: Diagnosis not present

## 2019-09-20 DIAGNOSIS — M9902 Segmental and somatic dysfunction of thoracic region: Secondary | ICD-10-CM | POA: Diagnosis not present

## 2019-09-20 DIAGNOSIS — M5384 Other specified dorsopathies, thoracic region: Secondary | ICD-10-CM | POA: Diagnosis not present

## 2019-09-20 DIAGNOSIS — M9901 Segmental and somatic dysfunction of cervical region: Secondary | ICD-10-CM | POA: Diagnosis not present

## 2019-09-27 DIAGNOSIS — M9901 Segmental and somatic dysfunction of cervical region: Secondary | ICD-10-CM | POA: Diagnosis not present

## 2019-09-27 DIAGNOSIS — M50323 Other cervical disc degeneration at C6-C7 level: Secondary | ICD-10-CM | POA: Diagnosis not present

## 2019-09-27 DIAGNOSIS — M5384 Other specified dorsopathies, thoracic region: Secondary | ICD-10-CM | POA: Diagnosis not present

## 2019-09-27 DIAGNOSIS — M9902 Segmental and somatic dysfunction of thoracic region: Secondary | ICD-10-CM | POA: Diagnosis not present

## 2019-10-01 DIAGNOSIS — M542 Cervicalgia: Secondary | ICD-10-CM | POA: Diagnosis not present

## 2019-10-01 DIAGNOSIS — F419 Anxiety disorder, unspecified: Secondary | ICD-10-CM | POA: Diagnosis not present

## 2019-10-27 ENCOUNTER — Other Ambulatory Visit: Payer: Self-pay

## 2019-10-28 ENCOUNTER — Encounter: Payer: Self-pay | Admitting: Obstetrics & Gynecology

## 2019-10-28 ENCOUNTER — Ambulatory Visit (INDEPENDENT_AMBULATORY_CARE_PROVIDER_SITE_OTHER): Payer: BC Managed Care – PPO | Admitting: Obstetrics & Gynecology

## 2019-10-28 VITALS — BP 134/88 | Ht 65.5 in | Wt 233.0 lb

## 2019-10-28 DIAGNOSIS — Z9889 Other specified postprocedural states: Secondary | ICD-10-CM | POA: Diagnosis not present

## 2019-10-28 DIAGNOSIS — E6609 Other obesity due to excess calories: Secondary | ICD-10-CM

## 2019-10-28 DIAGNOSIS — Z01419 Encounter for gynecological examination (general) (routine) without abnormal findings: Secondary | ICD-10-CM | POA: Diagnosis not present

## 2019-10-28 DIAGNOSIS — Z9851 Tubal ligation status: Secondary | ICD-10-CM | POA: Diagnosis not present

## 2019-10-28 DIAGNOSIS — Z6838 Body mass index (BMI) 38.0-38.9, adult: Secondary | ICD-10-CM

## 2019-10-28 NOTE — Progress Notes (Signed)
Melissa Beck [birth year] [id number]   History:    44 y.o. G3P3L3 and 2 daughters of her husband.  S/P TL.  4 yo girl, 36 yo girl, 42 yo girl, 22 yo son and 38 yo son.  RP:  Established patient presenting for annual gyn exam   HPI: Menses every 4-7 weeks x last 6 months, flow wnl.  No pelvic pain. No pain with IC.  No UTI Sx, no SUI.  BMs normal.  Breasts normal.  BMI 38.18, increased x last year.  Needs to exercise more. Health labs with Fam MD.   Past medical history,surgical history, family history and social history were all reviewed and documented in the EPIC chart.  Gynecologic History Patient's last menstrual period was 09/23/2019.  Obstetric History OB History  Gravida Para Term Preterm AB Living  3 3 2 1   3   SAB TAB Ectopic Multiple Live Births          3    # Outcome Date GA Lbr Len/2nd Weight Sex Delivery Anes PTL Lv  3 Term 04/26/13 [redacted]w[redacted]d 28:05 / 01:21 8 lb 2.7 oz (3.705 kg) M VBAC EPI  LIV  2 Preterm 2006 [redacted]w[redacted]d 01:00 4 lb 6 oz (1.984 kg) M CS-LTranv EPI  LIV  1 Term 1998 [redacted]w[redacted]d 05:00 6 lb 14 oz (3.118 kg) F Vag-Spont EPI  LIV     ROS: A ROS was performed and pertinent positives and negatives are included in the history.  GENERAL: No fevers or chills. HEENT: No change in vision, no earache, sore throat or sinus congestion. NECK: No pain or stiffness. CARDIOVASCULAR: No chest pain or pressure. No palpitations. PULMONARY: No shortness of breath, cough or wheeze. GASTROINTESTINAL: No abdominal pain, nausea, vomiting or diarrhea, melena or bright red blood per rectum. GENITOURINARY: No urinary frequency, urgency, hesitancy or dysuria. MUSCULOSKELETAL: No joint or muscle pain, no back pain, no recent trauma. DERMATOLOGIC: No rash, no itching, no lesions. ENDOCRINE: No polyuria, polydipsia, no heat or cold intolerance. No recent change in weight. HEMATOLOGICAL: No anemia or easy bruising or bleeding. NEUROLOGIC: No headache, seizures, numbness, tingling or weakness. PSYCHIATRIC:  No depression, no loss of interest in normal activity or change in sleep pattern.     Exam:   BP 134/88   Ht 5' 5.5" (1.664 m)   Wt 233 lb (105.7 kg)   LMP 09/23/2019   BMI 38.18 kg/m   Body mass index is 38.18 kg/m.  General appearance : Well developed well nourished female. No acute distress HEENT: Eyes: no retinal hemorrhage or exudates,  Neck supple, trachea midline, no carotid bruits, no thyroidmegaly Lungs: Clear to auscultation, no rhonchi or wheezes, or rib retractions  Heart: Regular rate and rhythm, no murmurs or gallops Breast:Examined in sitting and supine position were symmetrical in appearance, no palpable masses or tenderness,  no skin retraction, no nipple inversion, no nipple discharge, no skin discoloration, no axillary or supraclavicular lymphadenopathy Abdomen: no palpable masses or tenderness, no rebound or guarding Extremities: no edema or skin discoloration or tenderness  Pelvic: Vulva: Normal             Vagina: No gross lesions or discharge  Cervix: No gross lesions or discharge.  Pap reflex done.  Uterus  AV, normal size, shape and consistency, non-tender and mobile  Adnexa  Without masses or tenderness  Anus: Normal   Assessment/Plan:  44 y.o. female for annual exam   1. Encounter for routine gynecological examination with Papanicolaou smear of cervix  Normal gynecologic exam.  History of LEEP.  Pap reflex done.  Breast exam normal.  Screening mammogram August 2020 was negative.  Health labs with family physician.  2. H/O LEEP  3. S/P tubal ligation  4. Class 2 obesity due to excess calories without serious comorbidity with body mass index (BMI) of 38.0 to 38.9 in adult Recommend a lower calorie/carb diet such as [employer].  Aerobic physical activities with interval training 5 times a week and light weightlifting every 2 days.  Other orders - meloxicam (MOBIC) 7.5 MG tablet; Take 7.5 mg by mouth daily.  Princess Bruins MD, 4:26 PM  10/28/2019

## 2019-10-29 LAB — PAP IG W/ RFLX HPV ASCU

## 2019-11-03 ENCOUNTER — Encounter: Payer: Self-pay | Admitting: Obstetrics & Gynecology

## 2019-11-03 NOTE — Patient Instructions (Signed)
1. Encounter for routine gynecological examination with Papanicolaou smear of cervix Normal gynecologic exam.  History of LEEP.  Pap reflex done.  Breast exam normal.  Screening mammogram August 2020 was negative.  Health labs with family physician.  2. H/O LEEP  3. S/P tubal ligation  4. Class 2 obesity due to excess calories without serious comorbidity with body mass index (BMI) of 38.0 to 38.9 in adult Recommend a lower calorie/carb diet such as [employer].  Aerobic physical activities with interval training 5 times a week and light weightlifting every 2 days.  Other orders - meloxicam (MOBIC) 7.5 MG tablet; Take 7.5 mg by mouth daily.  Melissa Beck, it was a pleasure seeing you today!  I will inform you of your results as soon as they are available.

## 2020-10-09 DIAGNOSIS — Z79899 Other long term (current) drug therapy: Secondary | ICD-10-CM | POA: Diagnosis not present

## 2020-10-09 DIAGNOSIS — F419 Anxiety disorder, unspecified: Secondary | ICD-10-CM | POA: Diagnosis not present

## 2020-10-09 DIAGNOSIS — M542 Cervicalgia: Secondary | ICD-10-CM | POA: Diagnosis not present

## 2020-10-20 DIAGNOSIS — F419 Anxiety disorder, unspecified: Secondary | ICD-10-CM | POA: Diagnosis not present

## 2020-10-20 DIAGNOSIS — G4719 Other hypersomnia: Secondary | ICD-10-CM | POA: Diagnosis not present

## 2020-10-20 DIAGNOSIS — E669 Obesity, unspecified: Secondary | ICD-10-CM | POA: Diagnosis not present

## 2020-10-31 ENCOUNTER — Ambulatory Visit (INDEPENDENT_AMBULATORY_CARE_PROVIDER_SITE_OTHER): Payer: BC Managed Care – PPO | Admitting: Obstetrics & Gynecology

## 2020-10-31 ENCOUNTER — Encounter: Payer: Self-pay | Admitting: Obstetrics & Gynecology

## 2020-10-31 ENCOUNTER — Other Ambulatory Visit: Payer: Self-pay

## 2020-10-31 ENCOUNTER — Other Ambulatory Visit (HOSPITAL_COMMUNITY)
Admission: RE | Admit: 2020-10-31 | Discharge: 2020-10-31 | Disposition: A | Discharge status: Home / Self Care | Payer: BC Managed Care – PPO | Source: Ambulatory Visit | Attending: Obstetrics & Gynecology | Admitting: Obstetrics & Gynecology

## 2020-10-31 VITALS — BP 110/70 | Ht 65.0 in | Wt 226.0 lb

## 2020-10-31 DIAGNOSIS — R350 Frequency of micturition: Secondary | ICD-10-CM

## 2020-10-31 DIAGNOSIS — Z9851 Tubal ligation status: Secondary | ICD-10-CM

## 2020-10-31 DIAGNOSIS — Z6837 Body mass index (BMI) 37.0-37.9, adult: Secondary | ICD-10-CM

## 2020-10-31 DIAGNOSIS — Z01419 Encounter for gynecological examination (general) (routine) without abnormal findings: Secondary | ICD-10-CM

## 2020-10-31 DIAGNOSIS — Z9889 Other specified postprocedural states: Secondary | ICD-10-CM

## 2020-10-31 DIAGNOSIS — E6609 Other obesity due to excess calories: Secondary | ICD-10-CM

## 2020-10-31 DIAGNOSIS — R4581 Low self-esteem: Secondary | ICD-10-CM | POA: Diagnosis not present

## 2020-10-31 DIAGNOSIS — N898 Other specified noninflammatory disorders of vagina: Secondary | ICD-10-CM

## 2020-10-31 LAB — URINALYSIS, COMPLETE W/RFL CULTURE
Bacteria, UA: NONE SEEN /HPF
Bilirubin Urine: NEGATIVE
Glucose, UA: NEGATIVE
Hgb urine dipstick: NEGATIVE
Hyaline Cast: NONE SEEN /LPF
Ketones, ur: NEGATIVE
Leukocyte Esterase: NEGATIVE
Nitrites, Initial: NEGATIVE
Protein, ur: NEGATIVE
RBC / HPF: NONE SEEN /HPF (ref 0–2)
Specific Gravity, Urine: 1.02 (ref 1.001–1.03)
WBC, UA: NONE SEEN /HPF (ref 0–5)
pH: 7 (ref 5.0–8.0)

## 2020-10-31 LAB — NO CULTURE INDICATED

## 2020-10-31 LAB — WET PREP FOR TRICH, YEAST, CLUE

## 2020-10-31 MED ORDER — TINIDAZOLE 500 MG PO TABS: 1000.0000 mg | ORAL_TABLET | Freq: Two times a day (BID) | ORAL | 0 refills | Status: AC

## 2020-10-31 NOTE — Progress Notes (Signed)
Melissa Beck [birth year] [id number]   History:    45 y.o. G3P3L3 and 2 daughters of her husband.  S/P TL.  75 yo girl, 81 yo girl, 43 yo girl, 97 yo son and 57 yo son.  XM:[id number] presenting for annual gyn exam   UKR:CVKFMM every 4-8 weeks,flow wnl. No pelvic pain.No pain with IC.  C/O urinary frequency, no SUI. Odor with vaginal d/c. BMs normal. Breasts normal. BMI 37.61. Needs to exercise more.Health labs with Fam MD.  Past medical history,surgical history, family history and social history were all reviewed and documented in the EPIC chart.  Gynecologic History Patient's last menstrual period was 10/01/2020.  Obstetric History OB History  Gravida Para Term Preterm AB Living  3 3 2 1   3   SAB IAB Ectopic Multiple Live Births          3    # Outcome Date GA Lbr Len/2nd Weight Sex Delivery Anes PTL Lv  3 Term 04/26/13 [redacted]w[redacted]d 28:05 / 01:21 8 lb 2.7 oz (3.705 kg) M VBAC EPI  LIV  2 Preterm 2006 [redacted]w[redacted]d 01:00 4 lb 6 oz (1.984 kg) M CS-LTranv EPI  LIV  1 Term 1998 [redacted]w[redacted]d 05:00 6 lb 14 oz (3.118 kg) F Vag-Spont EPI  LIV     ROS: A ROS was performed and pertinent positives and negatives are included in the history.  GENERAL: No fevers or chills. HEENT: No change in vision, no earache, sore throat or sinus congestion. NECK: No pain or stiffness. CARDIOVASCULAR: No chest pain or pressure. No palpitations. PULMONARY: No shortness of breath, cough or wheeze. GASTROINTESTINAL: No abdominal pain, nausea, vomiting or diarrhea, melena or bright red blood per rectum. GENITOURINARY: No urinary frequency, urgency, hesitancy or dysuria. MUSCULOSKELETAL: No joint or muscle pain, no back pain, no recent trauma. DERMATOLOGIC: No rash, no itching, no lesions. ENDOCRINE: No polyuria, polydipsia, no heat or cold intolerance. No recent change in weight. HEMATOLOGICAL: No anemia or easy bruising or bleeding. NEUROLOGIC: No headache, seizures, numbness, tingling or weakness. PSYCHIATRIC: No  depression, no loss of interest in normal activity or change in sleep pattern.     Exam:   BP 110/70   Ht 5\' 5"  (1.651 m)   Wt 226 lb (102.5 kg)   LMP 10/01/2020   BMI 37.61 kg/m   Body mass index is 37.61 kg/m.  General appearance : Well developed well nourished female. No acute distress HEENT: Eyes: no retinal hemorrhage or exudates,  Neck supple, trachea midline, no carotid bruits, no thyroidmegaly Lungs: Clear to auscultation, no rhonchi or wheezes, or rib retractions  Heart: Regular rate and rhythm, no murmurs or gallops Breast:Examined in sitting and supine position were symmetrical in appearance, no palpable masses or tenderness,  no skin retraction, no nipple inversion, no nipple discharge, no skin discoloration, no axillary or supraclavicular lymphadenopathy Abdomen: no palpable masses or tenderness, no rebound or guarding Extremities: no edema or skin discoloration or tenderness  Pelvic: Vulva: Normal             Vagina: No gross lesions or discharge  Cervix: No gross lesions or discharge.  Pap reflex done.  Uterus  AV, normal size, shape and consistency, non-tender and mobile  Adnexa  Without masses or tenderness  Anus: Normal  U/A: Negative Wet prep: Clue cells present.  Odor present   Assessment/Plan:  45 y.o. female for annual exam   1. Encounter for routine gynecological examination with Papanicolaou smear of cervix Normal gynecologic exam.  Pap reflex  done.  Breast exam normal.  Will schedule a screening mammo.  Health labs with Fam PA. - Cytology - PAP( [place])  2. H/O LEEP Pap reflex done.  3. S/P tubal ligation  4. Frequency of urination U/A Negative. - Urinalysis,Complete w/RFL Culture  5. Vaginal odor Bacterial vaginosis confirmed by wet prep.  Will treat with Tinidazole.  Prescription sent to pharmacy. - WET PREP FOR TRICH, YEAST, CLUE  6. Class 2 obesity due to excess calories without serious comorbidity with body mass index (BMI) of  37.0 to 37.9 in adult Lower calorie/carb diet to continue.  Aerobic activities 5x a week and light weight lifting every 2 days.  Other orders - REFLEXIVE URINE CULTURE - tinidazole (TINDAMAX) 500 MG tablet; Take 2 tablets (1,000 mg total) by mouth 2 (two) times daily for 2 days.  Genia Del MD, 4:09 PM 10/31/2020

## 2020-11-02 LAB — CYTOLOGY - PAP: Diagnosis: NEGATIVE

## 2020-11-03 ENCOUNTER — Other Ambulatory Visit: Payer: Self-pay | Admitting: Obstetrics & Gynecology

## 2020-11-03 DIAGNOSIS — Z1231 Encounter for screening mammogram for malignant neoplasm of breast: Secondary | ICD-10-CM

## 2020-11-07 DIAGNOSIS — Z1231 Encounter for screening mammogram for malignant neoplasm of breast: Secondary | ICD-10-CM

## 2020-12-04 DIAGNOSIS — F4320 Adjustment disorder, unspecified: Secondary | ICD-10-CM | POA: Diagnosis not present

## 2020-12-26 ENCOUNTER — Ambulatory Visit
Admission: RE | Admit: 2020-12-26 | Discharge: 2020-12-26 | Disposition: A | Discharge status: Home / Self Care | Payer: BC Managed Care – PPO | Source: Ambulatory Visit | Attending: Physician Assistant | Admitting: Physician Assistant

## 2020-12-26 ENCOUNTER — Other Ambulatory Visit: Payer: Self-pay

## 2020-12-26 DIAGNOSIS — Z1231 Encounter for screening mammogram for malignant neoplasm of breast: Secondary | ICD-10-CM

## 2020-12-26 DIAGNOSIS — F4320 Adjustment disorder, unspecified: Secondary | ICD-10-CM | POA: Diagnosis not present

## 2021-01-16 DIAGNOSIS — F4320 Adjustment disorder, unspecified: Secondary | ICD-10-CM | POA: Diagnosis not present

## 2021-03-07 DIAGNOSIS — F4320 Adjustment disorder, unspecified: Secondary | ICD-10-CM | POA: Diagnosis not present

## 2021-03-22 DIAGNOSIS — F4320 Adjustment disorder, unspecified: Secondary | ICD-10-CM | POA: Diagnosis not present

## 2021-04-17 DIAGNOSIS — F4320 Adjustment disorder, unspecified: Secondary | ICD-10-CM | POA: Diagnosis not present

## 2021-05-14 DIAGNOSIS — F4320 Adjustment disorder, unspecified: Secondary | ICD-10-CM | POA: Diagnosis not present

## 2021-05-28 DIAGNOSIS — F4320 Adjustment disorder, unspecified: Secondary | ICD-10-CM | POA: Diagnosis not present

## 2021-05-30 DIAGNOSIS — F419 Anxiety disorder, unspecified: Secondary | ICD-10-CM | POA: Diagnosis not present

## 2021-05-30 DIAGNOSIS — M542 Cervicalgia: Secondary | ICD-10-CM | POA: Diagnosis not present

## 2021-05-30 DIAGNOSIS — F321 Major depressive disorder, single episode, moderate: Secondary | ICD-10-CM | POA: Diagnosis not present

## 2021-05-30 DIAGNOSIS — R400 Somnolence: Secondary | ICD-10-CM | POA: Diagnosis not present

## 2021-06-02 DIAGNOSIS — F4320 Adjustment disorder, unspecified: Secondary | ICD-10-CM | POA: Diagnosis not present

## 2021-06-21 DIAGNOSIS — F4320 Adjustment disorder, unspecified: Secondary | ICD-10-CM | POA: Diagnosis not present

## 2021-06-27 DIAGNOSIS — F419 Anxiety disorder, unspecified: Secondary | ICD-10-CM | POA: Diagnosis not present

## 2021-06-27 DIAGNOSIS — F321 Major depressive disorder, single episode, moderate: Secondary | ICD-10-CM | POA: Diagnosis not present

## 2021-07-03 DIAGNOSIS — F4320 Adjustment disorder, unspecified: Secondary | ICD-10-CM | POA: Diagnosis not present

## 2021-07-05 DIAGNOSIS — F4320 Adjustment disorder, unspecified: Secondary | ICD-10-CM | POA: Diagnosis not present

## 2021-07-07 DIAGNOSIS — F4320 Adjustment disorder, unspecified: Secondary | ICD-10-CM | POA: Diagnosis not present

## 2021-07-10 DIAGNOSIS — F4320 Adjustment disorder, unspecified: Secondary | ICD-10-CM | POA: Diagnosis not present

## 2021-07-13 DIAGNOSIS — F4320 Adjustment disorder, unspecified: Secondary | ICD-10-CM | POA: Diagnosis not present

## 2021-07-17 DIAGNOSIS — F4320 Adjustment disorder, unspecified: Secondary | ICD-10-CM | POA: Diagnosis not present

## 2021-07-19 DIAGNOSIS — F4320 Adjustment disorder, unspecified: Secondary | ICD-10-CM | POA: Diagnosis not present

## 2021-07-21 DIAGNOSIS — F4320 Adjustment disorder, unspecified: Secondary | ICD-10-CM | POA: Diagnosis not present

## 2021-08-07 DIAGNOSIS — F4320 Adjustment disorder, unspecified: Secondary | ICD-10-CM | POA: Diagnosis not present

## 2021-08-20 DIAGNOSIS — F4320 Adjustment disorder, unspecified: Secondary | ICD-10-CM | POA: Diagnosis not present

## 2021-08-25 DIAGNOSIS — F4320 Adjustment disorder, unspecified: Secondary | ICD-10-CM | POA: Diagnosis not present

## 2021-09-07 DIAGNOSIS — F4320 Adjustment disorder, unspecified: Secondary | ICD-10-CM | POA: Diagnosis not present

## 2021-09-15 DIAGNOSIS — F4320 Adjustment disorder, unspecified: Secondary | ICD-10-CM | POA: Diagnosis not present

## 2021-10-02 DIAGNOSIS — F4320 Adjustment disorder, unspecified: Secondary | ICD-10-CM | POA: Diagnosis not present

## 2021-10-30 DIAGNOSIS — F4320 Adjustment disorder, unspecified: Secondary | ICD-10-CM | POA: Diagnosis not present

## 2021-11-08 ENCOUNTER — Encounter: Payer: Self-pay | Admitting: Obstetrics & Gynecology

## 2021-11-08 ENCOUNTER — Ambulatory Visit (INDEPENDENT_AMBULATORY_CARE_PROVIDER_SITE_OTHER): Payer: BC Managed Care – PPO | Admitting: Obstetrics & Gynecology

## 2021-11-08 VITALS — BP 120/80 | HR 68 | Resp 16 | Ht 65.25 in | Wt 214.0 lb

## 2021-11-08 DIAGNOSIS — Z9851 Tubal ligation status: Secondary | ICD-10-CM | POA: Diagnosis not present

## 2021-11-08 DIAGNOSIS — N898 Other specified noninflammatory disorders of vagina: Secondary | ICD-10-CM

## 2021-11-08 DIAGNOSIS — Z01419 Encounter for gynecological examination (general) (routine) without abnormal findings: Secondary | ICD-10-CM

## 2021-11-08 LAB — WET PREP FOR TRICH, YEAST, CLUE

## 2021-11-08 MED ORDER — TINIDAZOLE 500 MG PO TABS: 1000.0000 mg | ORAL_TABLET | Freq: Two times a day (BID) | ORAL | 2 refills | Status: AC

## 2021-11-08 NOTE — Progress Notes (Addendum)
? ? ?Melissa Beck [birth year] [id number] ? ? ?History:    46 y.o. G3P3L3 and 2 daughters of her husband.  S/P TL.  86 yo girl, 15 yo girl, 44 yo girl, 82 yo son and 13 yo son. ?  ?RP:  Established patient presenting for annual gyn exam  ?  ?HPI: Menses every 4 weeks,flow wnl.  No pelvic pain. No pain with IC.  Odor with vaginal d/c.  Pap 10/2020 Neg. Urine normal.  BMs normal.  Breasts normal. Mammo 12/2020 Neg.  BMI 35.34. Needs to exercise more. Health labs with Fam MD. ? ? ?Past medical history,surgical history, family history and social history were all reviewed and documented in the EPIC chart. ? ?Gynecologic History ?Patient's last menstrual period was 10/16/2021 (exact date). ? ?Obstetric History ?OB History  ?Gravida Para Term Preterm AB Living  ?3 3 2 1   3   ?SAB IAB Ectopic Multiple Live Births  ?        3  ?  ?# Outcome Date GA Lbr Len/2nd Weight Sex Delivery Anes PTL Lv  ?3 Term 04/26/13 [redacted]w[redacted]d 28:05 / 01:21 8 lb 2.7 oz (3.705 kg) M VBAC EPI  LIV  ?2 Preterm 2006 [redacted]w[redacted]d 01:00 4 lb 6 oz (1.984 kg) M CS-LTranv EPI  LIV  ?1 Term 1998 [redacted]w[redacted]d 05:00 6 lb 14 oz (3.118 kg) F Vag-Spont EPI  LIV  ? ? ? ?ROS: A ROS was performed and pertinent positives and negatives are included in the history. ? GENERAL: No fevers or chills. HEENT: No change in vision, no earache, sore throat or sinus congestion. NECK: No pain or stiffness. CARDIOVASCULAR: No chest pain or pressure. No palpitations. PULMONARY: No shortness of breath, cough or wheeze. GASTROINTESTINAL: No abdominal pain, nausea, vomiting or diarrhea, melena or bright red blood per rectum. GENITOURINARY: No urinary frequency, urgency, hesitancy or dysuria. MUSCULOSKELETAL: No joint or muscle pain, no back pain, no recent trauma. DERMATOLOGIC: No rash, no itching, no lesions. ENDOCRINE: No polyuria, polydipsia, no heat or cold intolerance. No recent change in weight. HEMATOLOGICAL: No anemia or easy bruising or bleeding. NEUROLOGIC: No headache, seizures, numbness, tingling or  weakness. PSYCHIATRIC: No depression, no loss of interest in normal activity or change in sleep pattern.  ?  ? ?Exam: ? ? ?BP 120/80   Pulse 68   Resp 16   Ht 5' 5.25" (1.657 m)   Wt 214 lb (97.1 kg)   LMP 10/16/2021 (Exact Date) Comment: btl  BMI 35.34 kg/m?  ? ?Body mass index is 35.34 kg/m?. ? ?General appearance : Well developed well nourished female. No acute distress ?HEENT: Eyes: no retinal hemorrhage or exudates,  Neck supple, trachea midline, no carotid bruits, no thyroidmegaly ?Lungs: Clear to auscultation, no rhonchi or wheezes, or rib retractions  ?Heart: Regular rate and rhythm, no murmurs or gallops ?Breast:Examined in sitting and supine position were symmetrical in appearance, no palpable masses or tenderness,  no skin retraction, no nipple inversion, no nipple discharge, no skin discoloration, no axillary or supraclavicular lymphadenopathy ?Abdomen: no palpable masses or tenderness, no rebound or guarding ?Extremities: no edema or skin discoloration or tenderness ? ?Pelvic: Vulva: Normal ?            Vagina: No gross lesions or discharge ? Cervix: No gross lesions.  Mild discharge.  Wet prep done. ? Uterus  AV, normal size, shape and consistency, non-tender and mobile ? Adnexa  Without masses or tenderness ? Anus: Normal ? ?Wet prep:  Clue cells present with odor ? ? ?  Assessment/Plan:  46 y.o. female for annual exam  ? ?1. Well female exam with routine gynecological exam ?Menses every 4 weeks,flow wnl.  No pelvic pain. No pain with IC.  Odor with vaginal d/c.  Pap 10/2020 Neg. Urine normal.  BMs normal.  Breasts normal. Mammo 12/2020 Neg.  BMI 35.34. Lower calorie/carb diet.  Needs to exercise more. Health labs with Fam MD. ? ?2. S/P tubal ligation ? ?3. Vaginal odor ?Wet prep confirming Bacterial Vaginosis.  Will treat with Tinidazole.  Usage reviewed, prescription sent to pharmacy. ?- WET PREP FOR TRICH, YEAST, CLUE ? ?Other orders ?- Black Cohosh 40 MG CAPS; See admin instructions. ?-  buPROPion (WELLBUTRIN XL) 300 MG 24 hr tablet; Take 300 mg by mouth every morning. ?- tiZANidine (ZANAFLEX) 4 MG tablet; Take 4 mg by mouth at bedtime as needed. ?- escitalopram (LEXAPRO) 20 MG tablet; Take 20 mg by mouth daily. ?- Ascorbic Acid (VITAMIN C PO); Take by mouth. ?- UNABLE TO FIND; Med Name: vaginal insert for odor ?- tinidazole (TINDAMAX) 500 MG tablet; Take 2 tablets (1,000 mg total) by mouth 2 (two) times daily for 2 days.  ? ?Genia Del MD, 1:34 PM 11/08/2021 ? ?  ?

## 2021-11-13 DIAGNOSIS — F4320 Adjustment disorder, unspecified: Secondary | ICD-10-CM | POA: Diagnosis not present

## 2021-11-29 DIAGNOSIS — F4320 Adjustment disorder, unspecified: Secondary | ICD-10-CM | POA: Diagnosis not present

## 2021-11-30 DIAGNOSIS — F4320 Adjustment disorder, unspecified: Secondary | ICD-10-CM | POA: Diagnosis not present

## 2021-12-07 DIAGNOSIS — F4320 Adjustment disorder, unspecified: Secondary | ICD-10-CM | POA: Diagnosis not present

## 2021-12-08 DIAGNOSIS — F4320 Adjustment disorder, unspecified: Secondary | ICD-10-CM | POA: Diagnosis not present

## 2021-12-10 ENCOUNTER — Other Ambulatory Visit: Payer: Self-pay | Admitting: Obstetrics & Gynecology

## 2021-12-10 DIAGNOSIS — Z1231 Encounter for screening mammogram for malignant neoplasm of breast: Secondary | ICD-10-CM

## 2021-12-26 DIAGNOSIS — F4320 Adjustment disorder, unspecified: Secondary | ICD-10-CM | POA: Diagnosis not present

## 2021-12-27 ENCOUNTER — Ambulatory Visit: Payer: BC Managed Care – PPO

## 2021-12-31 DIAGNOSIS — F4320 Adjustment disorder, unspecified: Secondary | ICD-10-CM | POA: Diagnosis not present

## 2022-01-03 DIAGNOSIS — F4320 Adjustment disorder, unspecified: Secondary | ICD-10-CM | POA: Diagnosis not present

## 2022-01-04 DIAGNOSIS — F4320 Adjustment disorder, unspecified: Secondary | ICD-10-CM | POA: Diagnosis not present

## 2022-01-07 ENCOUNTER — Ambulatory Visit: Payer: BC Managed Care – PPO

## 2022-01-09 DIAGNOSIS — R5383 Other fatigue: Secondary | ICD-10-CM | POA: Diagnosis not present

## 2022-01-09 DIAGNOSIS — E559 Vitamin D deficiency, unspecified: Secondary | ICD-10-CM | POA: Diagnosis not present

## 2022-01-09 DIAGNOSIS — F411 Generalized anxiety disorder: Secondary | ICD-10-CM | POA: Diagnosis not present

## 2022-01-09 DIAGNOSIS — F321 Major depressive disorder, single episode, moderate: Secondary | ICD-10-CM | POA: Diagnosis not present

## 2022-01-11 DIAGNOSIS — F4320 Adjustment disorder, unspecified: Secondary | ICD-10-CM | POA: Diagnosis not present

## 2022-01-15 ENCOUNTER — Ambulatory Visit
Admission: RE | Admit: 2022-01-15 | Discharge: 2022-01-15 | Disposition: A | Discharge status: Home / Self Care | Payer: BC Managed Care – PPO | Source: Ambulatory Visit | Attending: Obstetrics & Gynecology | Admitting: Obstetrics & Gynecology

## 2022-01-15 DIAGNOSIS — Z1231 Encounter for screening mammogram for malignant neoplasm of breast: Secondary | ICD-10-CM | POA: Diagnosis not present

## 2022-01-19 DIAGNOSIS — F4320 Adjustment disorder, unspecified: Secondary | ICD-10-CM | POA: Diagnosis not present

## 2022-01-25 DIAGNOSIS — F4320 Adjustment disorder, unspecified: Secondary | ICD-10-CM | POA: Diagnosis not present

## 2022-02-02 DIAGNOSIS — F4320 Adjustment disorder, unspecified: Secondary | ICD-10-CM | POA: Diagnosis not present

## 2022-02-04 DIAGNOSIS — F4312 Post-traumatic stress disorder, chronic: Secondary | ICD-10-CM | POA: Diagnosis not present

## 2022-02-04 DIAGNOSIS — F331 Major depressive disorder, recurrent, moderate: Secondary | ICD-10-CM | POA: Diagnosis not present

## 2022-02-04 DIAGNOSIS — F4320 Adjustment disorder, unspecified: Secondary | ICD-10-CM | POA: Diagnosis not present

## 2022-02-05 DIAGNOSIS — F4320 Adjustment disorder, unspecified: Secondary | ICD-10-CM | POA: Diagnosis not present

## 2022-02-08 DIAGNOSIS — F4320 Adjustment disorder, unspecified: Secondary | ICD-10-CM | POA: Diagnosis not present

## 2022-02-12 DIAGNOSIS — F4320 Adjustment disorder, unspecified: Secondary | ICD-10-CM | POA: Diagnosis not present

## 2022-02-16 DIAGNOSIS — F4320 Adjustment disorder, unspecified: Secondary | ICD-10-CM | POA: Diagnosis not present

## 2022-02-18 DIAGNOSIS — F331 Major depressive disorder, recurrent, moderate: Secondary | ICD-10-CM | POA: Diagnosis not present

## 2022-02-18 DIAGNOSIS — F4312 Post-traumatic stress disorder, chronic: Secondary | ICD-10-CM | POA: Diagnosis not present

## 2022-02-27 DIAGNOSIS — F4320 Adjustment disorder, unspecified: Secondary | ICD-10-CM | POA: Diagnosis not present

## 2022-03-02 DIAGNOSIS — F4320 Adjustment disorder, unspecified: Secondary | ICD-10-CM | POA: Diagnosis not present

## 2022-03-04 DIAGNOSIS — F4320 Adjustment disorder, unspecified: Secondary | ICD-10-CM | POA: Diagnosis not present

## 2022-03-08 DIAGNOSIS — F411 Generalized anxiety disorder: Secondary | ICD-10-CM | POA: Diagnosis not present

## 2022-03-08 DIAGNOSIS — F321 Major depressive disorder, single episode, moderate: Secondary | ICD-10-CM | POA: Diagnosis not present

## 2022-03-08 DIAGNOSIS — G479 Sleep disorder, unspecified: Secondary | ICD-10-CM | POA: Diagnosis not present

## 2022-03-12 DIAGNOSIS — F4320 Adjustment disorder, unspecified: Secondary | ICD-10-CM | POA: Diagnosis not present

## 2022-03-14 DIAGNOSIS — D225 Melanocytic nevi of trunk: Secondary | ICD-10-CM | POA: Diagnosis not present

## 2022-03-14 DIAGNOSIS — D2261 Melanocytic nevi of right upper limb, including shoulder: Secondary | ICD-10-CM | POA: Diagnosis not present

## 2022-03-14 DIAGNOSIS — D485 Neoplasm of uncertain behavior of skin: Secondary | ICD-10-CM | POA: Diagnosis not present

## 2022-03-14 DIAGNOSIS — L738 Other specified follicular disorders: Secondary | ICD-10-CM | POA: Diagnosis not present

## 2022-03-14 DIAGNOSIS — D2262 Melanocytic nevi of left upper limb, including shoulder: Secondary | ICD-10-CM | POA: Diagnosis not present

## 2022-03-14 DIAGNOSIS — D235 Other benign neoplasm of skin of trunk: Secondary | ICD-10-CM | POA: Diagnosis not present

## 2022-03-23 DIAGNOSIS — F4320 Adjustment disorder, unspecified: Secondary | ICD-10-CM | POA: Diagnosis not present

## 2022-03-28 DIAGNOSIS — F4320 Adjustment disorder, unspecified: Secondary | ICD-10-CM | POA: Diagnosis not present

## 2022-04-01 DIAGNOSIS — F4312 Post-traumatic stress disorder, chronic: Secondary | ICD-10-CM | POA: Diagnosis not present

## 2022-04-01 DIAGNOSIS — F331 Major depressive disorder, recurrent, moderate: Secondary | ICD-10-CM | POA: Diagnosis not present

## 2022-04-12 DIAGNOSIS — F4320 Adjustment disorder, unspecified: Secondary | ICD-10-CM | POA: Diagnosis not present

## 2022-04-29 DIAGNOSIS — N3642 Intrinsic sphincter deficiency (ISD): Secondary | ICD-10-CM | POA: Diagnosis not present

## 2022-04-29 DIAGNOSIS — R82998 Other abnormal findings in urine: Secondary | ICD-10-CM | POA: Diagnosis not present

## 2022-04-29 DIAGNOSIS — N393 Stress incontinence (female) (male): Secondary | ICD-10-CM | POA: Diagnosis not present

## 2022-05-03 DIAGNOSIS — F4320 Adjustment disorder, unspecified: Secondary | ICD-10-CM | POA: Diagnosis not present

## 2022-05-17 DIAGNOSIS — F4320 Adjustment disorder, unspecified: Secondary | ICD-10-CM | POA: Diagnosis not present

## 2022-05-22 HISTORY — PX: INCONTINENCE SURGERY: SHX676

## 2022-06-03 DIAGNOSIS — F4320 Adjustment disorder, unspecified: Secondary | ICD-10-CM | POA: Diagnosis not present

## 2022-06-10 DIAGNOSIS — N393 Stress incontinence (female) (male): Secondary | ICD-10-CM | POA: Diagnosis not present

## 2022-06-10 DIAGNOSIS — N3642 Intrinsic sphincter deficiency (ISD): Secondary | ICD-10-CM | POA: Diagnosis not present

## 2022-06-17 DIAGNOSIS — N393 Stress incontinence (female) (male): Secondary | ICD-10-CM | POA: Diagnosis not present

## 2022-06-18 DIAGNOSIS — N393 Stress incontinence (female) (male): Secondary | ICD-10-CM | POA: Diagnosis not present

## 2022-06-18 DIAGNOSIS — N3642 Intrinsic sphincter deficiency (ISD): Secondary | ICD-10-CM | POA: Diagnosis not present

## 2022-06-18 DIAGNOSIS — N9089 Other specified noninflammatory disorders of vulva and perineum: Secondary | ICD-10-CM | POA: Diagnosis not present

## 2022-06-19 DIAGNOSIS — F4320 Adjustment disorder, unspecified: Secondary | ICD-10-CM | POA: Diagnosis not present

## 2022-06-24 DIAGNOSIS — F4312 Post-traumatic stress disorder, chronic: Secondary | ICD-10-CM | POA: Diagnosis not present

## 2022-06-24 DIAGNOSIS — F331 Major depressive disorder, recurrent, moderate: Secondary | ICD-10-CM | POA: Diagnosis not present

## 2022-06-24 DIAGNOSIS — F4320 Adjustment disorder, unspecified: Secondary | ICD-10-CM | POA: Diagnosis not present

## 2022-07-02 DIAGNOSIS — F4320 Adjustment disorder, unspecified: Secondary | ICD-10-CM | POA: Diagnosis not present

## 2022-07-05 DIAGNOSIS — N393 Stress incontinence (female) (male): Secondary | ICD-10-CM | POA: Diagnosis not present

## 2022-07-05 DIAGNOSIS — N3642 Intrinsic sphincter deficiency (ISD): Secondary | ICD-10-CM | POA: Diagnosis not present

## 2022-07-05 DIAGNOSIS — N907 Vulvar cyst: Secondary | ICD-10-CM | POA: Diagnosis not present

## 2022-07-09 DIAGNOSIS — F4320 Adjustment disorder, unspecified: Secondary | ICD-10-CM | POA: Diagnosis not present

## 2022-08-10 DIAGNOSIS — F4320 Adjustment disorder, unspecified: Secondary | ICD-10-CM | POA: Diagnosis not present

## 2022-08-16 DIAGNOSIS — N907 Vulvar cyst: Secondary | ICD-10-CM | POA: Diagnosis not present

## 2022-08-16 DIAGNOSIS — F4320 Adjustment disorder, unspecified: Secondary | ICD-10-CM | POA: Diagnosis not present

## 2022-08-16 DIAGNOSIS — Z9889 Other specified postprocedural states: Secondary | ICD-10-CM | POA: Diagnosis not present

## 2022-08-16 DIAGNOSIS — N3642 Intrinsic sphincter deficiency (ISD): Secondary | ICD-10-CM | POA: Diagnosis not present

## 2022-08-16 DIAGNOSIS — N393 Stress incontinence (female) (male): Secondary | ICD-10-CM | POA: Diagnosis not present

## 2022-08-24 DIAGNOSIS — F4320 Adjustment disorder, unspecified: Secondary | ICD-10-CM | POA: Diagnosis not present

## 2022-09-06 DIAGNOSIS — F411 Generalized anxiety disorder: Secondary | ICD-10-CM | POA: Diagnosis not present

## 2022-09-06 DIAGNOSIS — N9089 Other specified noninflammatory disorders of vulva and perineum: Secondary | ICD-10-CM | POA: Diagnosis not present

## 2022-09-06 DIAGNOSIS — N909 Noninflammatory disorder of vulva and perineum, unspecified: Secondary | ICD-10-CM | POA: Diagnosis not present

## 2022-09-12 DIAGNOSIS — F4320 Adjustment disorder, unspecified: Secondary | ICD-10-CM | POA: Diagnosis not present

## 2022-09-16 DIAGNOSIS — F4312 Post-traumatic stress disorder, chronic: Secondary | ICD-10-CM | POA: Diagnosis not present

## 2022-09-16 DIAGNOSIS — F331 Major depressive disorder, recurrent, moderate: Secondary | ICD-10-CM | POA: Diagnosis not present

## 2022-10-02 DIAGNOSIS — F411 Generalized anxiety disorder: Secondary | ICD-10-CM | POA: Diagnosis not present

## 2022-10-04 DIAGNOSIS — F4320 Adjustment disorder, unspecified: Secondary | ICD-10-CM | POA: Diagnosis not present

## 2022-10-09 DIAGNOSIS — F4320 Adjustment disorder, unspecified: Secondary | ICD-10-CM | POA: Diagnosis not present

## 2022-10-14 DIAGNOSIS — F411 Generalized anxiety disorder: Secondary | ICD-10-CM | POA: Diagnosis not present

## 2022-10-30 DIAGNOSIS — F4320 Adjustment disorder, unspecified: Secondary | ICD-10-CM | POA: Diagnosis not present

## 2022-10-31 DIAGNOSIS — F331 Major depressive disorder, recurrent, moderate: Secondary | ICD-10-CM | POA: Diagnosis not present

## 2022-10-31 DIAGNOSIS — F4312 Post-traumatic stress disorder, chronic: Secondary | ICD-10-CM | POA: Diagnosis not present

## 2022-11-12 ENCOUNTER — Encounter: Payer: Self-pay | Admitting: Obstetrics & Gynecology

## 2022-11-12 ENCOUNTER — Ambulatory Visit (INDEPENDENT_AMBULATORY_CARE_PROVIDER_SITE_OTHER): Payer: BC Managed Care – PPO | Admitting: Obstetrics & Gynecology

## 2022-11-12 ENCOUNTER — Other Ambulatory Visit (HOSPITAL_COMMUNITY)
Admission: RE | Admit: 2022-11-12 | Discharge: 2022-11-12 | Disposition: A | Discharge status: Home / Self Care | Payer: BC Managed Care – PPO | Source: Ambulatory Visit | Attending: Obstetrics & Gynecology | Admitting: Obstetrics & Gynecology

## 2022-11-12 VITALS — BP 118/80 | HR 70 | Ht 65.75 in | Wt 218.0 lb

## 2022-11-12 DIAGNOSIS — Z9851 Tubal ligation status: Secondary | ICD-10-CM

## 2022-11-12 DIAGNOSIS — Z13220 Encounter for screening for lipoid disorders: Secondary | ICD-10-CM | POA: Diagnosis not present

## 2022-11-12 DIAGNOSIS — G4719 Other hypersomnia: Secondary | ICD-10-CM | POA: Diagnosis not present

## 2022-11-12 DIAGNOSIS — Z01419 Encounter for gynecological examination (general) (routine) without abnormal findings: Secondary | ICD-10-CM | POA: Insufficient documentation

## 2022-11-12 DIAGNOSIS — N943 Premenstrual tension syndrome: Secondary | ICD-10-CM | POA: Diagnosis not present

## 2022-11-12 DIAGNOSIS — Z0000 Encounter for general adult medical examination without abnormal findings: Secondary | ICD-10-CM | POA: Diagnosis not present

## 2022-11-12 NOTE — Progress Notes (Signed)
Melissa Beck 1975-12-30 [id number]   History:    47 y.o. G3P3L3 and 2 daughters of her husband.  S/P TL.  67 yo girl, 24 yo girl, 45 yo girl, 72 yo son and 42 yo son.  And grand-children.   RP:  Established patient presenting for annual gyn exam    HPI: Menses every 4 weeks,flow wnl. No BTB.  C/O PMS with cyclic emotional down.  S/P TL.  Prefers not to start on BCPs.  Will discuss with psychiatrist and psychotherapist.  No pelvic pain. No pain with IC.  Pap 10/2020 Neg. Pap reflex today.  Urine normal.  BMs normal.  Breasts normal. Mammo 12/2021 Neg.  BMI 35.45. Needs to exercise more. Health labs with Fam MD done today.   Past medical history,surgical history, family history and social history were all reviewed and documented in the EPIC chart.  Gynecologic History Patient's last menstrual period was 11/05/2022 (exact date).  Obstetric History OB History  Gravida Para Term Preterm AB Living  SAB IAB Ectopic Multiple Live Births          3    # Outcome Date GA Lbr Len/2nd Weight Sex Delivery Anes PTL Lv  3 Term 04/26/13 [redacted]w[redacted]d 28:05 / 01:21 8 lb 2.7 oz (3.705 kg) M VBAC EPI  LIV  2 Preterm 2006 [redacted]w[redacted]d 01:00 4 lb 6 oz (1.984 kg) M CS-LTranv EPI  LIV  1 Term 1998 [redacted]w[redacted]d 05:00 6 lb 14 oz (3.118 kg) F Vag-Spont EPI  LIV     ROS: A ROS was performed and pertinent positives and negatives are included in the history. GENERAL: No fevers or chills. HEENT: No change in vision, no earache, sore throat or sinus congestion. NECK: No pain or stiffness. CARDIOVASCULAR: No chest pain or pressure. No palpitations. PULMONARY: No shortness of breath, cough or wheeze. GASTROINTESTINAL: No abdominal pain, nausea, vomiting or diarrhea, melena or bright red blood per rectum. GENITOURINARY: No urinary frequency, urgency, hesitancy or dysuria. MUSCULOSKELETAL: No joint or muscle pain, no back pain, no recent trauma. DERMATOLOGIC: No rash, no itching, no lesions. ENDOCRINE: No polyuria, polydipsia, no heat  or cold intolerance. No recent change in weight. HEMATOLOGICAL: No anemia or easy bruising or bleeding. NEUROLOGIC: No headache, seizures, numbness, tingling or weakness. PSYCHIATRIC: No depression, no loss of interest in normal activity or change in sleep pattern.     Exam:   BP 118/80   Pulse 70   Ht 5' 5.75" (1.67 m)   Wt 218 lb (98.9 kg)   LMP 11/05/2022 (Exact Date) Comment: sexually active, btl  SpO2 98%   BMI 35.45 kg/m   Body mass index is 35.45 kg/m.  General appearance : Well developed well nourished female. No acute distress HEENT: Eyes: no retinal hemorrhage or exudates,  Neck supple, trachea midline, no carotid bruits, no thyroidmegaly Lungs: Clear to auscultation, no rhonchi or wheezes, or rib retractions  Heart: Regular rate and rhythm, no murmurs or gallops Breast:Examined in sitting and supine position were symmetrical in appearance, no palpable masses or tenderness,  no skin retraction, no nipple inversion, no nipple discharge, no skin discoloration, no axillary or supraclavicular lymphadenopathy Abdomen: no palpable masses or tenderness, no rebound or guarding Extremities: no edema or skin discoloration or tenderness  Pelvic: Vulva: Normal             Vagina: No gross lesions or discharge  Cervix: No gross lesions or discharge.  Pap reflex done.  Uterus  AV, normal size, shape and consistency, non-tender and mobile  Adnexa  Without masses or tenderness  Anus: Normal   Assessment/Plan:  47 y.o. female for annual exam   1. Encounter for routine gynecological examination with Papanicolaou smear of cervix Menses every 4 weeks,flow wnl. No BTB.  C/O PMS with cyclic emotional down.  S/P TL.  Prefers not to start on BCPs.  Will discuss with psychiatrist and psychotherapist.  No pelvic pain. No pain with IC.  Pap 10/2020 Neg. Pap reflex today.  Urine normal.  BMs normal.  Breasts normal. Mammo 12/2021 Neg.  BMI 35.45. Needs to exercise more. Health labs with Fam MD done  today. - Cytology - PAP( [facility])  2. S/P tubal ligation  3. PMS (premenstrual syndrome) Menses every 4 weeks,flow wnl. No BTB.  C/O PMS with cyclic emotional down.  S/P TL.  Prefers not to start on BCPs.  Will discuss with psychiatrist and psychotherapist. Discussed the possibility of taking an Anti-Depressant x 2 weeks premenstrually.  Increase fitness activities during PMS.  Other orders - escitalopram (LEXAPRO) 20 MG tablet; Take by mouth. - B Complex Vitamins (VITAMIN B-COMPLEX) TABS; Take 1 tablet by mouth daily. - Cholecalciferol (VITAMIN D3) 50 MCG (2000 UT) capsule; Take by mouth. - Biotin 10 MG TABS; 1 tablet Orally Once a day - UNABLE TO FIND; Med Name: immunity - traZODone (DESYREL) 50 MG tablet; Take 50 mg by mouth at bedtime.   Genia Del MD, 3:50 PM

## 2022-11-14 DIAGNOSIS — F4320 Adjustment disorder, unspecified: Secondary | ICD-10-CM | POA: Diagnosis not present

## 2022-11-15 LAB — CYTOLOGY - PAP: Diagnosis: NEGATIVE

## 2022-11-27 DIAGNOSIS — F4320 Adjustment disorder, unspecified: Secondary | ICD-10-CM | POA: Diagnosis not present

## 2022-12-04 DIAGNOSIS — E559 Vitamin D deficiency, unspecified: Secondary | ICD-10-CM | POA: Diagnosis not present

## 2022-12-04 DIAGNOSIS — E611 Iron deficiency: Secondary | ICD-10-CM | POA: Diagnosis not present

## 2022-12-04 DIAGNOSIS — E669 Obesity, unspecified: Secondary | ICD-10-CM | POA: Diagnosis not present

## 2022-12-04 DIAGNOSIS — G4733 Obstructive sleep apnea (adult) (pediatric): Secondary | ICD-10-CM | POA: Diagnosis not present

## 2022-12-24 DIAGNOSIS — G4733 Obstructive sleep apnea (adult) (pediatric): Secondary | ICD-10-CM | POA: Diagnosis not present

## 2022-12-24 DIAGNOSIS — F5101 Primary insomnia: Secondary | ICD-10-CM | POA: Diagnosis not present

## 2023-01-09 ENCOUNTER — Other Ambulatory Visit: Payer: Self-pay | Admitting: Obstetrics & Gynecology

## 2023-01-09 DIAGNOSIS — G4733 Obstructive sleep apnea (adult) (pediatric): Secondary | ICD-10-CM | POA: Diagnosis not present

## 2023-01-09 DIAGNOSIS — F411 Generalized anxiety disorder: Secondary | ICD-10-CM | POA: Diagnosis not present

## 2023-01-09 DIAGNOSIS — Z1231 Encounter for screening mammogram for malignant neoplasm of breast: Secondary | ICD-10-CM

## 2023-01-09 DIAGNOSIS — R400 Somnolence: Secondary | ICD-10-CM | POA: Diagnosis not present

## 2023-01-23 DIAGNOSIS — G4733 Obstructive sleep apnea (adult) (pediatric): Secondary | ICD-10-CM | POA: Diagnosis not present

## 2023-01-23 DIAGNOSIS — F5101 Primary insomnia: Secondary | ICD-10-CM | POA: Diagnosis not present

## 2023-01-26 IMAGING — MG MM DIGITAL SCREENING BILAT W/ TOMO AND CAD
8 series · 8 of 24 positions shown · non-contrast
Comparison: Previous exam(s).

CLINICAL DATA: Screening.

EXAM:
DIGITAL SCREENING BILATERAL MAMMOGRAM WITH TOMOSYNTHESIS AND CAD
TECHNIQUE: Bilateral screening digital craniocaudal and mediolateral oblique
mammograms were obtained. Bilateral screening digital breast
tomosynthesis was performed. The images were evaluated with
computer-aided detection.

[R MLO synth-2D]
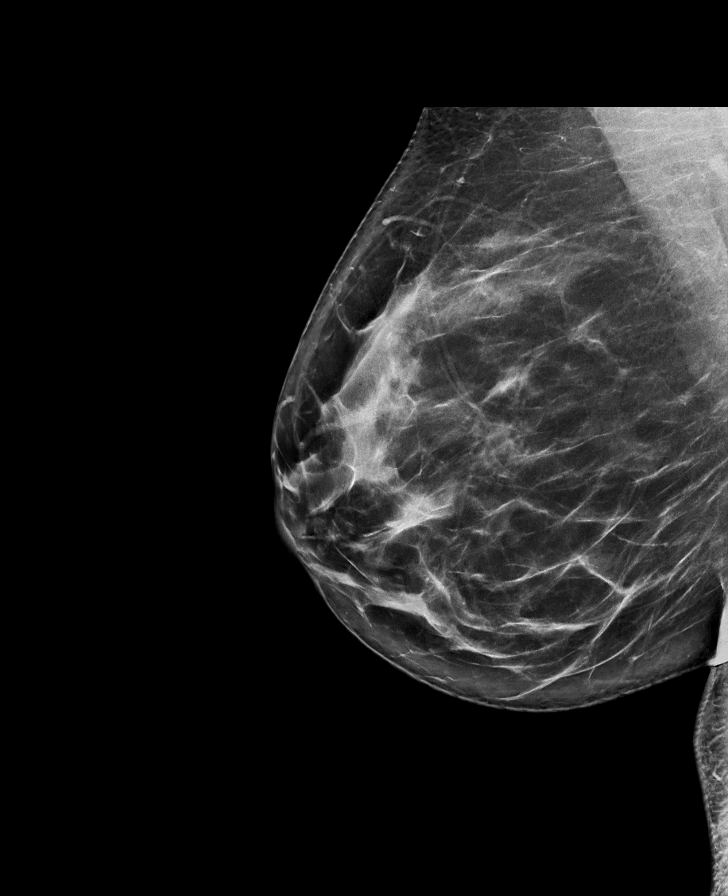

[R CC synth-2D]
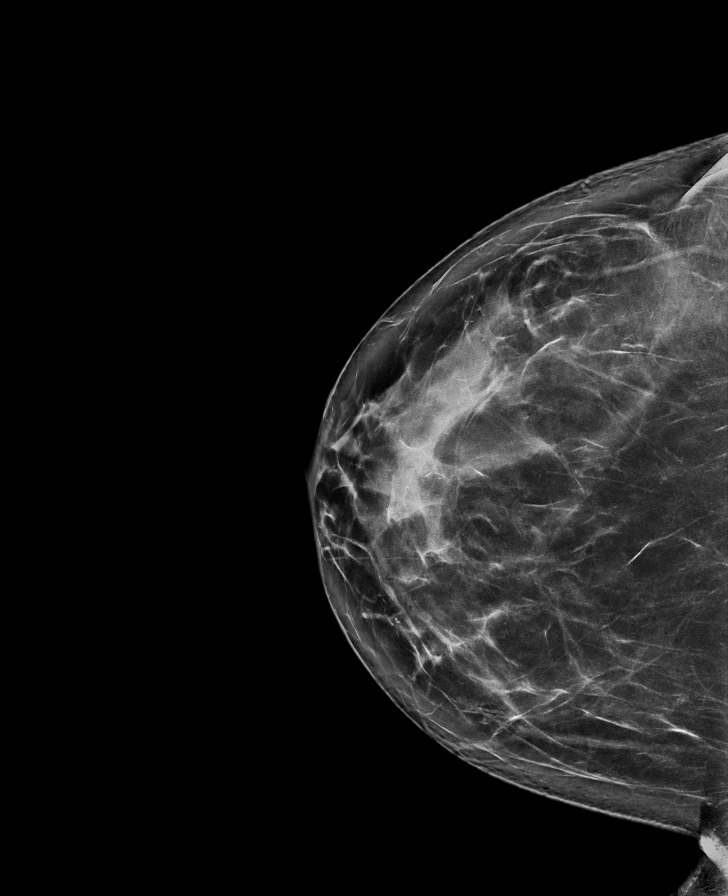

[L CC synth-2D]
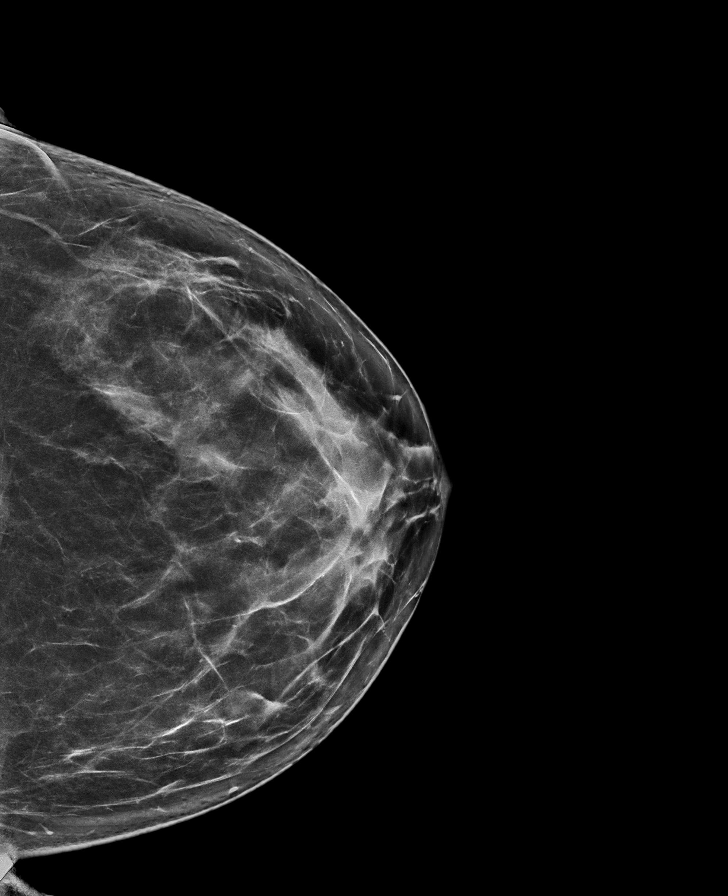

[L MLO synth-2D]
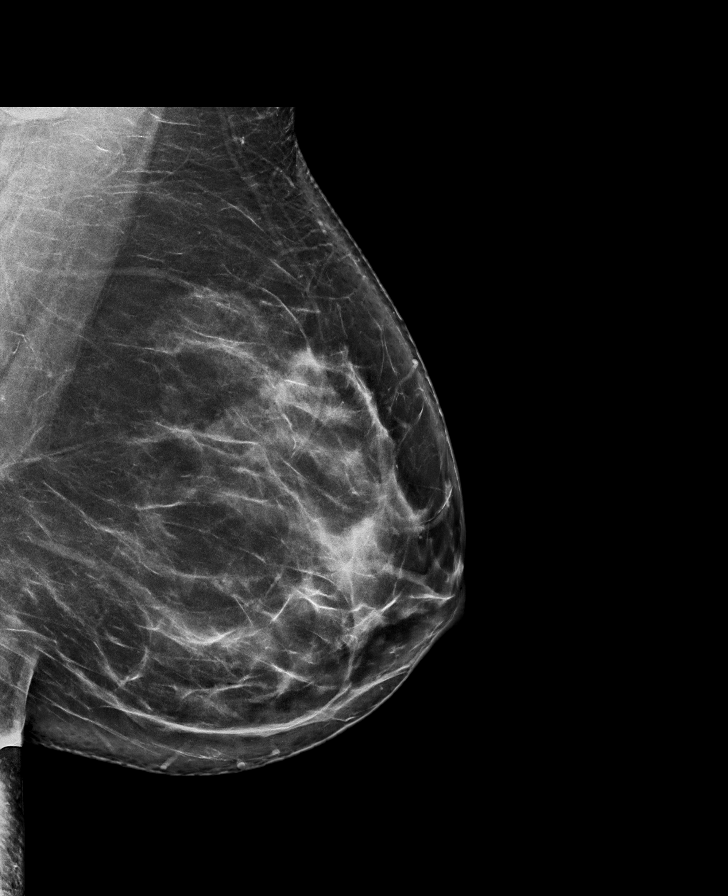

[R CC tomo · tomo slice 45/89.0]
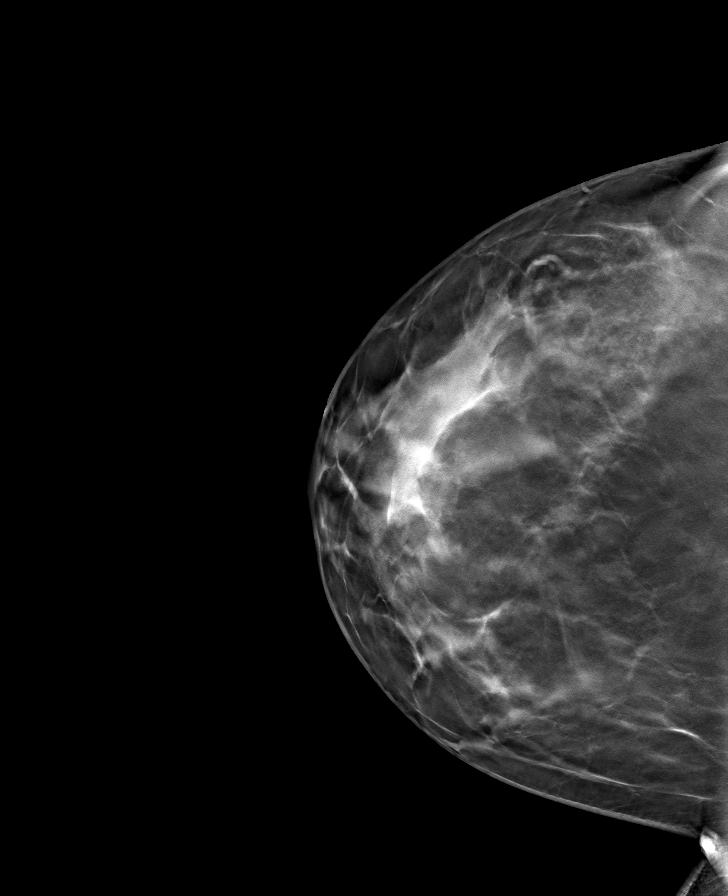

[R MLO tomo · tomo slice 47/92.0]
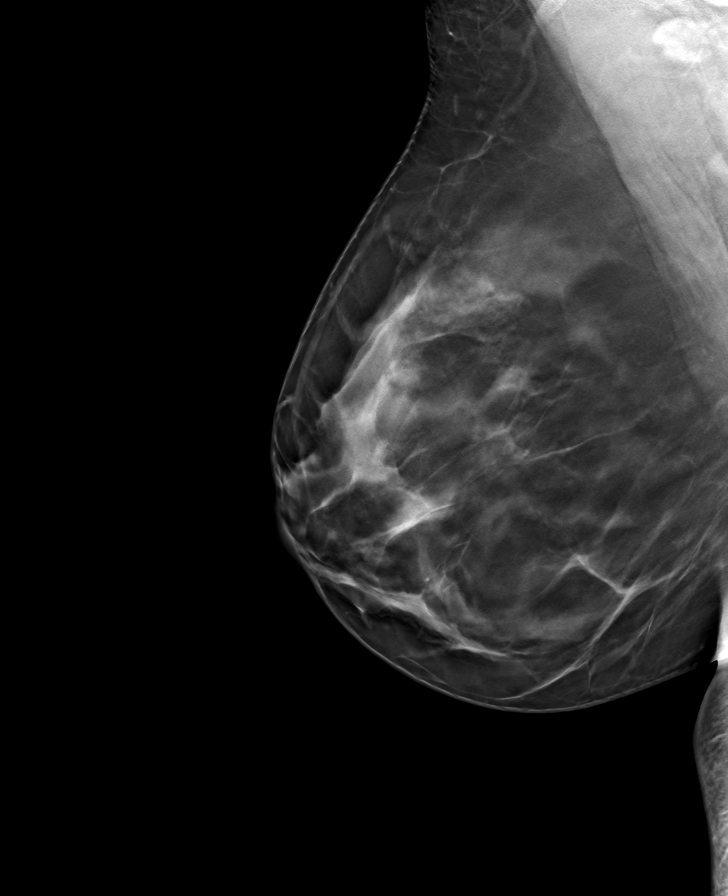

[L MLO tomo · tomo slice 49/96.0]
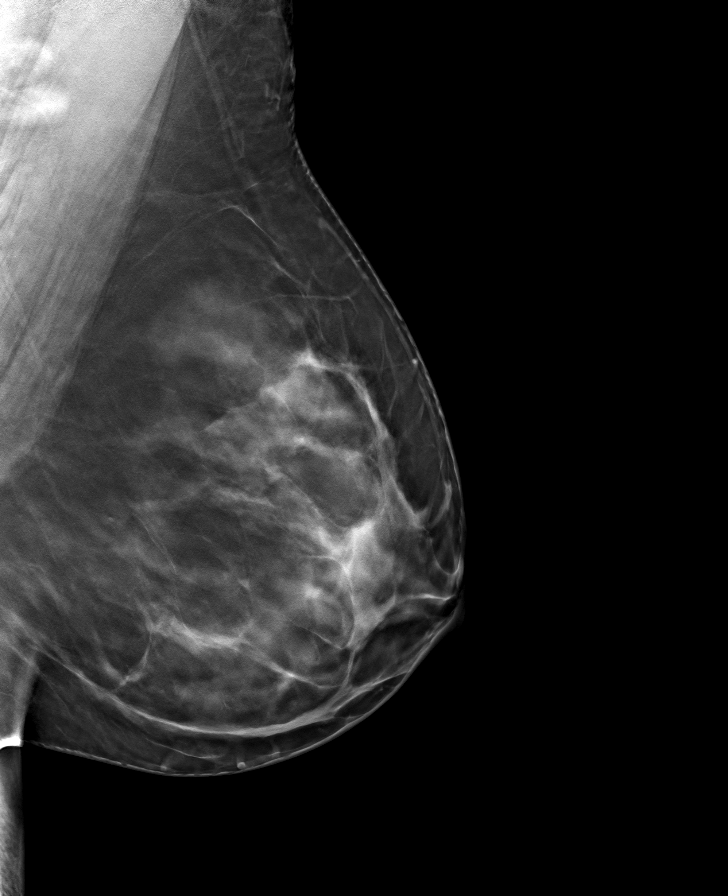

[L CC tomo · tomo slice 41/82.0]
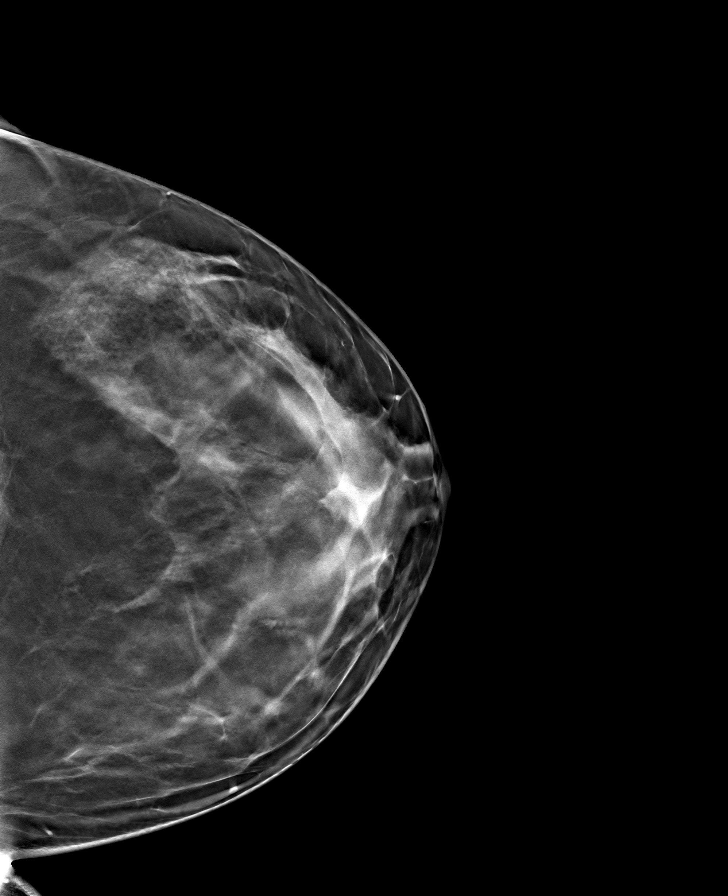

[8 of 24 positions shown; findings below may reference images not displayed]

ACR Breast Density Category c: The breast tissue is heterogeneously
dense, which may obscure small masses.
FINDINGS: There are no findings suspicious for malignancy. The images were
evaluated with computer-aided detection.
IMPRESSION: No mammographic evidence of malignancy. A result letter of this
screening mammogram will be mailed directly to the patient.

RECOMMENDATION:
Screening mammogram in one year. (Code:T4-5-GWO)

BI-RADS CATEGORY  1: Negative.

## 2023-01-28 ENCOUNTER — Ambulatory Visit
Admission: RE | Admit: 2023-01-28 | Discharge: 2023-01-28 | Disposition: A | Discharge status: Home / Self Care | Payer: BC Managed Care – PPO | Source: Ambulatory Visit | Attending: Obstetrics & Gynecology | Admitting: Obstetrics & Gynecology

## 2023-01-28 DIAGNOSIS — Z1231 Encounter for screening mammogram for malignant neoplasm of breast: Secondary | ICD-10-CM

## 2023-02-10 DIAGNOSIS — F4320 Adjustment disorder, unspecified: Secondary | ICD-10-CM | POA: Diagnosis not present

## 2023-02-23 DIAGNOSIS — G4733 Obstructive sleep apnea (adult) (pediatric): Secondary | ICD-10-CM | POA: Diagnosis not present

## 2023-02-23 DIAGNOSIS — F5101 Primary insomnia: Secondary | ICD-10-CM | POA: Diagnosis not present

## 2023-02-27 DIAGNOSIS — F4320 Adjustment disorder, unspecified: Secondary | ICD-10-CM | POA: Diagnosis not present

## 2023-03-07 DIAGNOSIS — F4320 Adjustment disorder, unspecified: Secondary | ICD-10-CM | POA: Diagnosis not present

## 2023-03-18 DIAGNOSIS — L814 Other melanin hyperpigmentation: Secondary | ICD-10-CM | POA: Diagnosis not present

## 2023-03-18 DIAGNOSIS — D2262 Melanocytic nevi of left upper limb, including shoulder: Secondary | ICD-10-CM | POA: Diagnosis not present

## 2023-03-18 DIAGNOSIS — D225 Melanocytic nevi of trunk: Secondary | ICD-10-CM | POA: Diagnosis not present

## 2023-03-18 DIAGNOSIS — D2261 Melanocytic nevi of right upper limb, including shoulder: Secondary | ICD-10-CM | POA: Diagnosis not present

## 2023-03-21 DIAGNOSIS — F4320 Adjustment disorder, unspecified: Secondary | ICD-10-CM | POA: Diagnosis not present

## 2023-03-26 DIAGNOSIS — F5101 Primary insomnia: Secondary | ICD-10-CM | POA: Diagnosis not present

## 2023-03-26 DIAGNOSIS — G4733 Obstructive sleep apnea (adult) (pediatric): Secondary | ICD-10-CM | POA: Diagnosis not present

## 2023-04-03 DIAGNOSIS — E611 Iron deficiency: Secondary | ICD-10-CM | POA: Diagnosis not present

## 2023-04-03 DIAGNOSIS — G4733 Obstructive sleep apnea (adult) (pediatric): Secondary | ICD-10-CM | POA: Diagnosis not present

## 2023-04-03 DIAGNOSIS — E559 Vitamin D deficiency, unspecified: Secondary | ICD-10-CM | POA: Diagnosis not present

## 2023-04-03 DIAGNOSIS — F5101 Primary insomnia: Secondary | ICD-10-CM | POA: Diagnosis not present

## 2023-04-14 DIAGNOSIS — F4320 Adjustment disorder, unspecified: Secondary | ICD-10-CM | POA: Diagnosis not present

## 2023-05-02 DIAGNOSIS — F4320 Adjustment disorder, unspecified: Secondary | ICD-10-CM | POA: Diagnosis not present

## 2023-05-23 DIAGNOSIS — F4320 Adjustment disorder, unspecified: Secondary | ICD-10-CM | POA: Diagnosis not present

## 2023-06-17 DIAGNOSIS — F4320 Adjustment disorder, unspecified: Secondary | ICD-10-CM | POA: Diagnosis not present

## 2023-07-04 DIAGNOSIS — F4323 Adjustment disorder with mixed anxiety and depressed mood: Secondary | ICD-10-CM | POA: Diagnosis not present

## 2023-07-21 DIAGNOSIS — F4323 Adjustment disorder with mixed anxiety and depressed mood: Secondary | ICD-10-CM | POA: Diagnosis not present

## 2023-07-31 DIAGNOSIS — F4323 Adjustment disorder with mixed anxiety and depressed mood: Secondary | ICD-10-CM | POA: Diagnosis not present

## 2023-08-14 DIAGNOSIS — F4323 Adjustment disorder with mixed anxiety and depressed mood: Secondary | ICD-10-CM | POA: Diagnosis not present

## 2023-08-19 DIAGNOSIS — F419 Anxiety disorder, unspecified: Secondary | ICD-10-CM | POA: Diagnosis not present

## 2023-08-19 DIAGNOSIS — F321 Major depressive disorder, single episode, moderate: Secondary | ICD-10-CM | POA: Diagnosis not present

## 2023-09-11 DIAGNOSIS — F4323 Adjustment disorder with mixed anxiety and depressed mood: Secondary | ICD-10-CM | POA: Diagnosis not present

## 2023-09-19 DIAGNOSIS — F4323 Adjustment disorder with mixed anxiety and depressed mood: Secondary | ICD-10-CM | POA: Diagnosis not present

## 2023-09-23 DIAGNOSIS — N390 Urinary tract infection, site not specified: Secondary | ICD-10-CM | POA: Diagnosis not present

## 2023-10-06 DIAGNOSIS — F4323 Adjustment disorder with mixed anxiety and depressed mood: Secondary | ICD-10-CM | POA: Diagnosis not present

## 2023-11-03 DIAGNOSIS — F4323 Adjustment disorder with mixed anxiety and depressed mood: Secondary | ICD-10-CM | POA: Diagnosis not present

## 2023-11-11 DIAGNOSIS — F4323 Adjustment disorder with mixed anxiety and depressed mood: Secondary | ICD-10-CM | POA: Diagnosis not present

## 2023-11-17 DIAGNOSIS — F411 Generalized anxiety disorder: Secondary | ICD-10-CM | POA: Diagnosis not present

## 2023-11-17 DIAGNOSIS — J069 Acute upper respiratory infection, unspecified: Secondary | ICD-10-CM | POA: Diagnosis not present

## 2023-11-18 DIAGNOSIS — E611 Iron deficiency: Secondary | ICD-10-CM | POA: Diagnosis not present

## 2023-11-18 DIAGNOSIS — Z124 Encounter for screening for malignant neoplasm of cervix: Secondary | ICD-10-CM | POA: Diagnosis not present

## 2023-11-18 DIAGNOSIS — B349 Viral infection, unspecified: Secondary | ICD-10-CM | POA: Diagnosis not present

## 2023-11-18 DIAGNOSIS — Z13220 Encounter for screening for lipoid disorders: Secondary | ICD-10-CM | POA: Diagnosis not present

## 2023-11-18 DIAGNOSIS — R41840 Attention and concentration deficit: Secondary | ICD-10-CM | POA: Diagnosis not present

## 2023-11-18 DIAGNOSIS — Z0000 Encounter for general adult medical examination without abnormal findings: Secondary | ICD-10-CM | POA: Diagnosis not present

## 2023-11-28 DIAGNOSIS — F4323 Adjustment disorder with mixed anxiety and depressed mood: Secondary | ICD-10-CM | POA: Diagnosis not present

## 2023-12-31 DIAGNOSIS — F4323 Adjustment disorder with mixed anxiety and depressed mood: Secondary | ICD-10-CM | POA: Diagnosis not present

## 2024-01-07 ENCOUNTER — Other Ambulatory Visit: Payer: Self-pay | Admitting: Physician Assistant

## 2024-01-07 DIAGNOSIS — Z1231 Encounter for screening mammogram for malignant neoplasm of breast: Secondary | ICD-10-CM

## 2024-01-19 DIAGNOSIS — F4323 Adjustment disorder with mixed anxiety and depressed mood: Secondary | ICD-10-CM | POA: Diagnosis not present

## 2024-02-02 ENCOUNTER — Ambulatory Visit
Admission: RE | Admit: 2024-02-02 | Discharge: 2024-02-02 | Disposition: A | Discharge status: Home / Self Care | Source: Ambulatory Visit | Attending: Physician Assistant | Admitting: Physician Assistant

## 2024-02-02 DIAGNOSIS — Z1231 Encounter for screening mammogram for malignant neoplasm of breast: Secondary | ICD-10-CM | POA: Diagnosis not present

## 2024-02-16 DIAGNOSIS — F4323 Adjustment disorder with mixed anxiety and depressed mood: Secondary | ICD-10-CM | POA: Diagnosis not present

## 2024-03-02 DIAGNOSIS — F4323 Adjustment disorder with mixed anxiety and depressed mood: Secondary | ICD-10-CM | POA: Diagnosis not present

## 2024-03-17 DIAGNOSIS — D2261 Melanocytic nevi of right upper limb, including shoulder: Secondary | ICD-10-CM | POA: Diagnosis not present

## 2024-03-17 DIAGNOSIS — F4323 Adjustment disorder with mixed anxiety and depressed mood: Secondary | ICD-10-CM | POA: Diagnosis not present

## 2024-03-17 DIAGNOSIS — L821 Other seborrheic keratosis: Secondary | ICD-10-CM | POA: Diagnosis not present

## 2024-03-17 DIAGNOSIS — D2262 Melanocytic nevi of left upper limb, including shoulder: Secondary | ICD-10-CM | POA: Diagnosis not present

## 2024-03-17 DIAGNOSIS — D225 Melanocytic nevi of trunk: Secondary | ICD-10-CM | POA: Diagnosis not present

## 2024-04-05 DIAGNOSIS — F4323 Adjustment disorder with mixed anxiety and depressed mood: Secondary | ICD-10-CM | POA: Diagnosis not present

## 2024-04-13 DIAGNOSIS — F909 Attention-deficit hyperactivity disorder, unspecified type: Secondary | ICD-10-CM | POA: Diagnosis not present

## 2024-04-16 DIAGNOSIS — F4323 Adjustment disorder with mixed anxiety and depressed mood: Secondary | ICD-10-CM | POA: Diagnosis not present

## 2024-04-19 DIAGNOSIS — F909 Attention-deficit hyperactivity disorder, unspecified type: Secondary | ICD-10-CM | POA: Diagnosis not present

## 2024-04-20 DIAGNOSIS — F909 Attention-deficit hyperactivity disorder, unspecified type: Secondary | ICD-10-CM | POA: Diagnosis not present

## 2024-05-03 DIAGNOSIS — F4323 Adjustment disorder with mixed anxiety and depressed mood: Secondary | ICD-10-CM | POA: Diagnosis not present

## 2024-05-18 DIAGNOSIS — F4323 Adjustment disorder with mixed anxiety and depressed mood: Secondary | ICD-10-CM | POA: Diagnosis not present
